# Patient Record
Sex: Female | Born: 1935 | Race: White | Hispanic: No | Marital: Married | Smoking: Never smoker
Health system: Southern US, Community
[De-identification: ages and names within clinical notes are randomized; demographics above are authoritative.]

## PROBLEM LIST (undated history)

## (undated) DIAGNOSIS — N189 Chronic kidney disease, unspecified: Secondary | ICD-10-CM

## (undated) DIAGNOSIS — M8580 Other specified disorders of bone density and structure, unspecified site: Secondary | ICD-10-CM

## (undated) DIAGNOSIS — H409 Unspecified glaucoma: Secondary | ICD-10-CM

## (undated) DIAGNOSIS — N200 Calculus of kidney: Secondary | ICD-10-CM

## (undated) DIAGNOSIS — H3530 Unspecified macular degeneration: Secondary | ICD-10-CM

## (undated) DIAGNOSIS — E785 Hyperlipidemia, unspecified: Secondary | ICD-10-CM

## (undated) DIAGNOSIS — I10 Essential (primary) hypertension: Secondary | ICD-10-CM

## (undated) HISTORY — PX: APPENDECTOMY: SHX54

## (undated) HISTORY — PX: CHOLECYSTECTOMY: SHX55

## (undated) HISTORY — PX: OTHER SURGICAL HISTORY: SHX169

## (undated) HISTORY — PX: CATARACT EXTRACTION: SUR2

## (undated) HISTORY — PX: ABDOMINAL HYSTERECTOMY: SHX81

## (undated) HISTORY — PX: KIDNEY SURGERY: SHX687

## (undated) HISTORY — PX: HERNIA REPAIR: SHX51

---

## 2004-11-02 ENCOUNTER — Ambulatory Visit: Payer: Self-pay | Admitting: Family Medicine

## 2005-03-01 ENCOUNTER — Ambulatory Visit: Payer: Self-pay | Admitting: Unknown Physician Specialty

## 2005-10-05 ENCOUNTER — Emergency Department: Payer: Self-pay | Admitting: Emergency Medicine

## 2005-10-11 ENCOUNTER — Emergency Department: Payer: Self-pay | Admitting: Emergency Medicine

## 2005-10-15 ENCOUNTER — Emergency Department: Payer: Self-pay | Admitting: Unknown Physician Specialty

## 2005-10-15 ENCOUNTER — Other Ambulatory Visit: Payer: Self-pay

## 2005-10-23 ENCOUNTER — Emergency Department: Payer: Self-pay | Admitting: Internal Medicine

## 2005-11-06 ENCOUNTER — Ambulatory Visit: Payer: Self-pay | Admitting: General Surgery

## 2005-11-19 ENCOUNTER — Ambulatory Visit: Payer: Self-pay | Admitting: Family Medicine

## 2006-11-26 ENCOUNTER — Ambulatory Visit: Payer: Self-pay | Admitting: Family Medicine

## 2008-01-20 IMAGING — CR DG ABDOMEN 3V
1 series · 4 of 4 positions shown · non-contrast
Comparison: none

REASON FOR EXAM: Chest pain
COMMENTS:

[Series 1: view not recorded · 0.17mm/px · 4 of 4 slices shown]
[im 1/4]
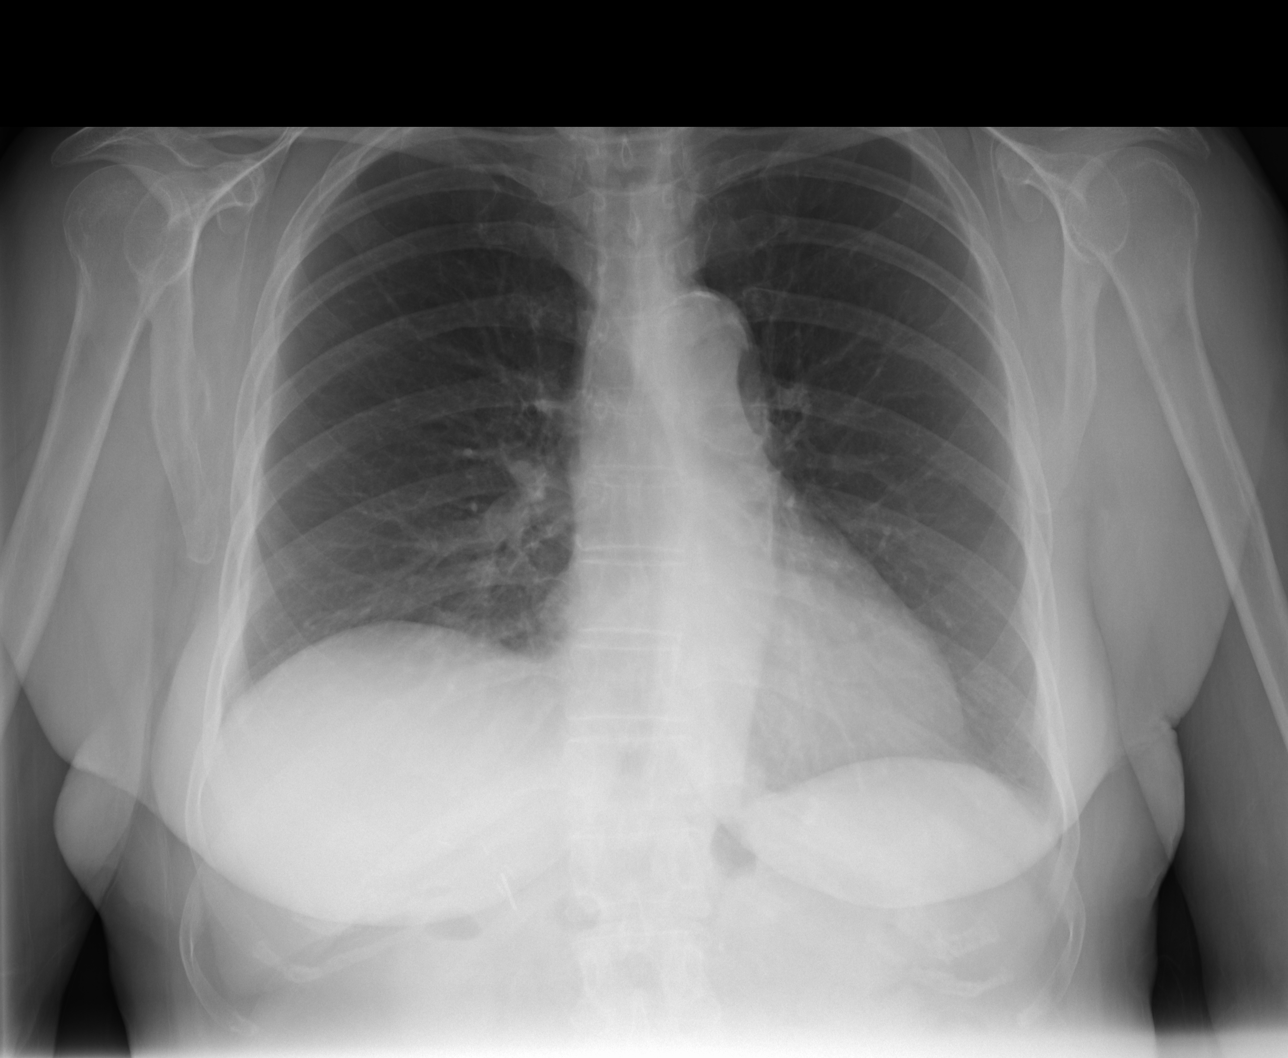
[im 2/4]
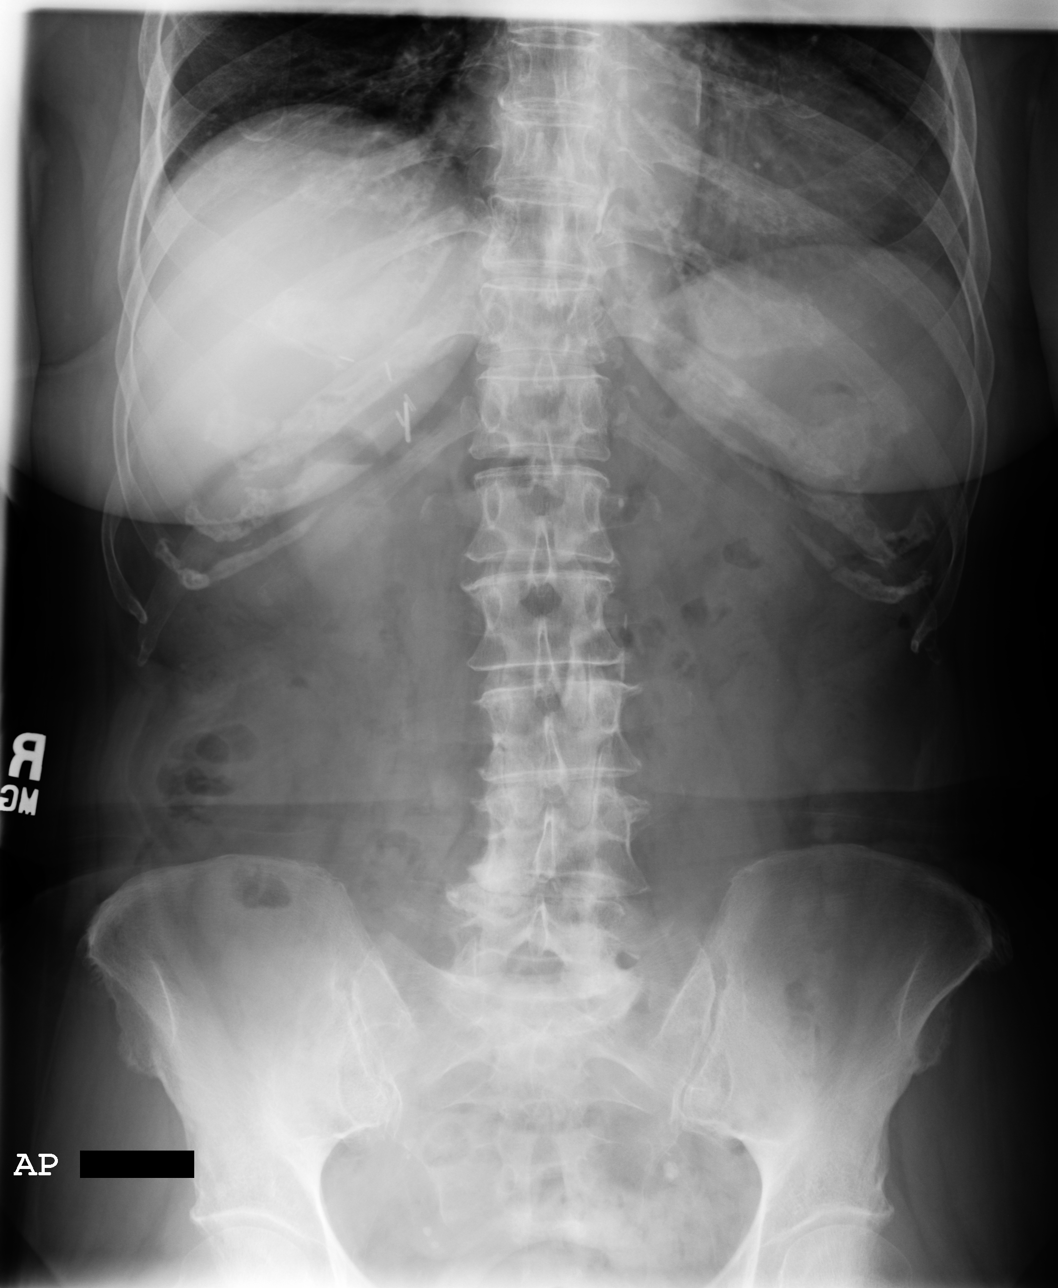
[im 3/4]
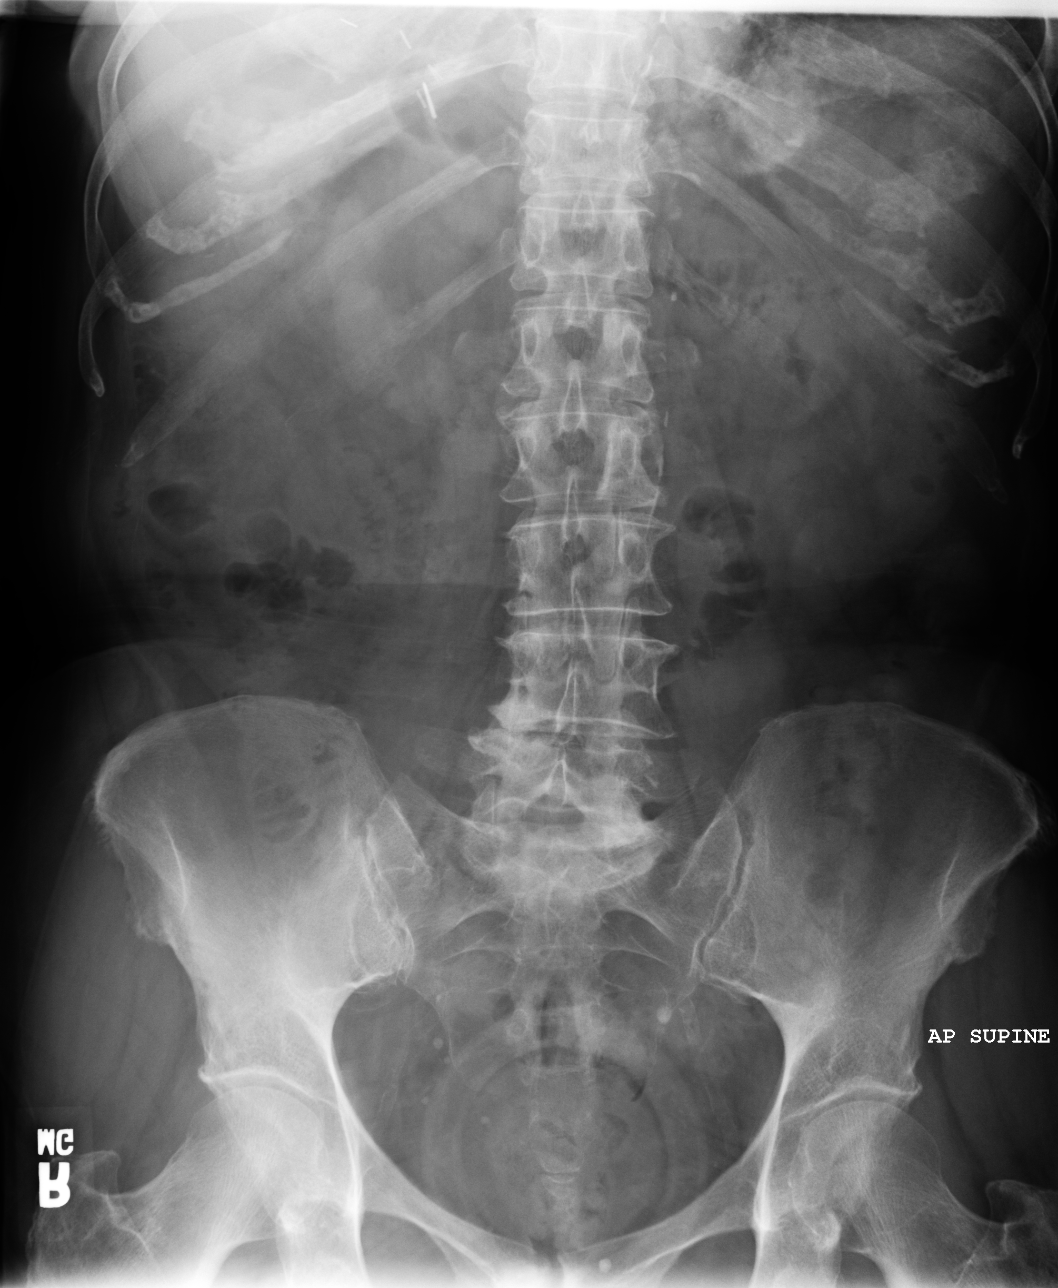
[im 4/4]
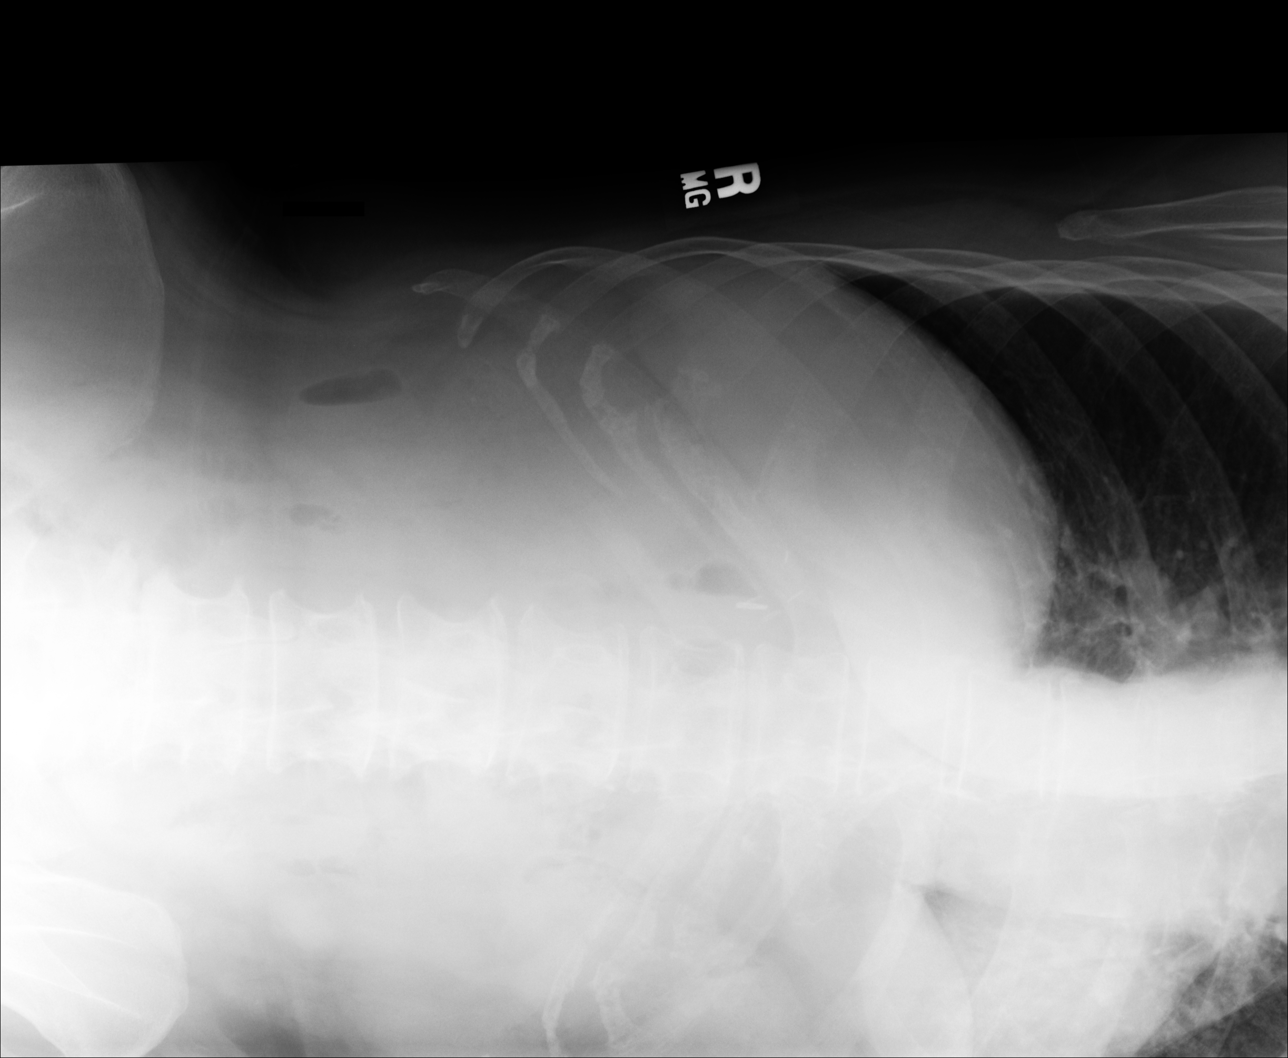

[4 of 4 positions shown; findings below may reference images not displayed]

PROCEDURE:     DXR - DXR ABDOMEN 3-WAY (INCL PA CXR)  - October 05, 2005 [DATE]

RESULT:       PA view of the chest shows the lung fields to be clear.  The
heart, mediastinal and osseous structures show no significant abnormalities.

Three views of the abdomen were obtained.  No subdiaphragmatic free air is
seen.  The bowel gas pattern shows no specific abnormalities.   No dilated
loops of bowel are seen.  There are a few widely scattered small bowel fluid
levels in nondilated loops of bowel, primarily in the RIGHT abdomen.  Such
changes are nonspecific and can be seen with gastroenteritis, enteritis or
intra-abdominal inflammation.   No acute bony abnormalities are seen.
IMPRESSION: No acute changes are identified.

There are a few nonspecific scattered air-fluid levels in loops of small
bowel.  No bowel obstruction is evident.

PA view of the chest shows the lung fields to be clear.

## 2008-01-26 IMAGING — CT CT STONE STUDY
1 of 3 series · 15 of 32 positions shown, 19 images · non-contrast
Comparison: none

REASON FOR EXAM: Abdominal pain
COMMENTS:

[Series 2: soft tissue · axial · 0.61mm/px · z∈[-862,-454]mm · 15 of 148 slices shown, 19 images]
[im 6/148  soft-tissue]
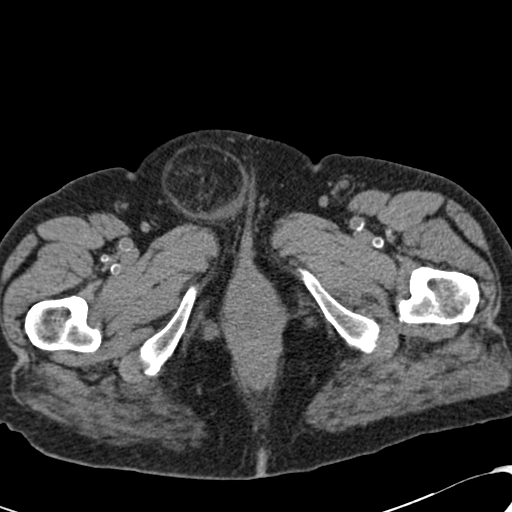
[im 6/148  bone]
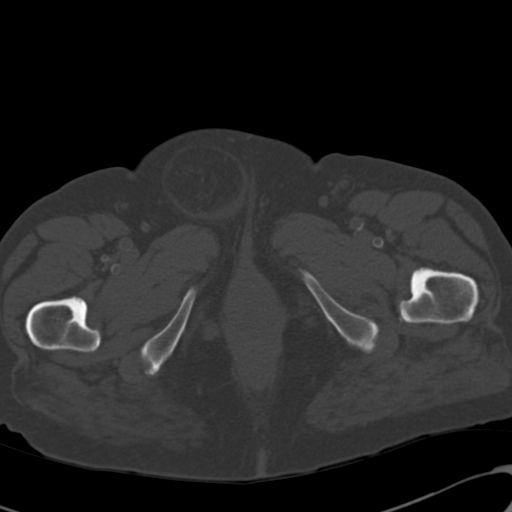
[im 17/148  soft-tissue]
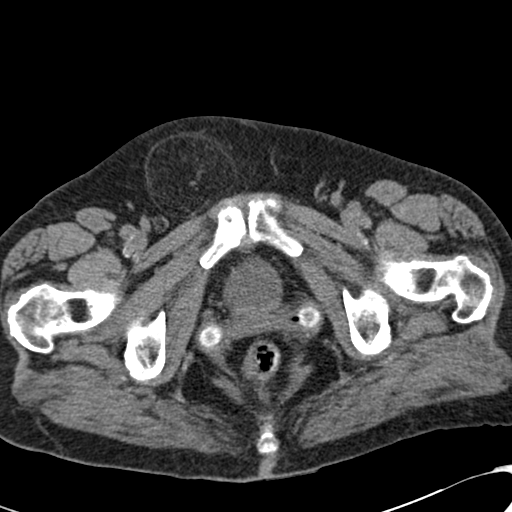
[im 29/148  soft-tissue]
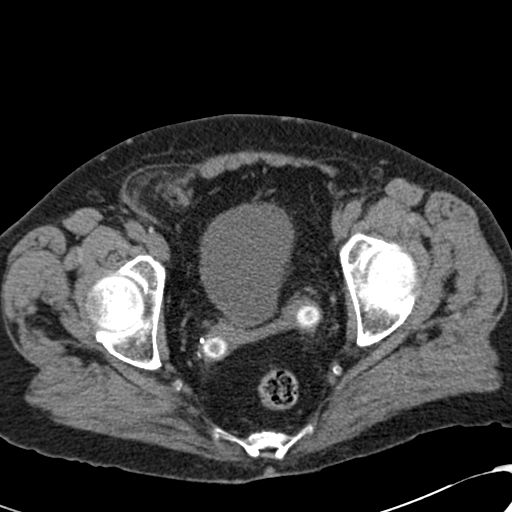
[im 40/148  soft-tissue]
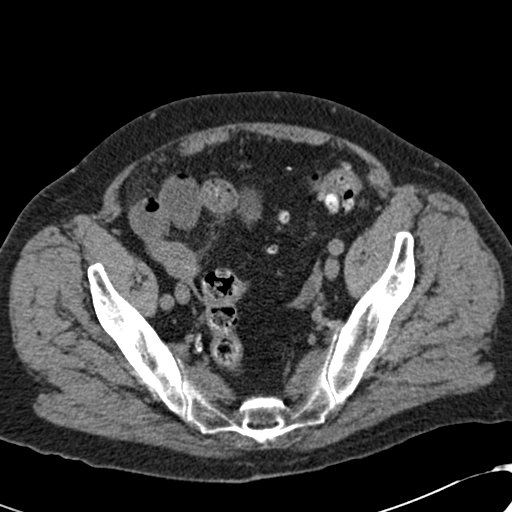
[im 51/148  soft-tissue]
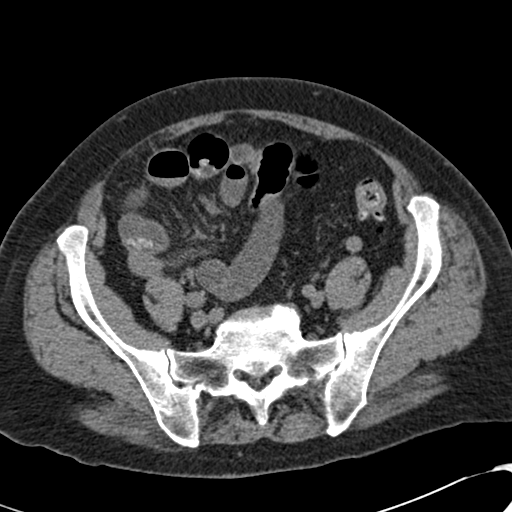
[im 63/148  soft-tissue]
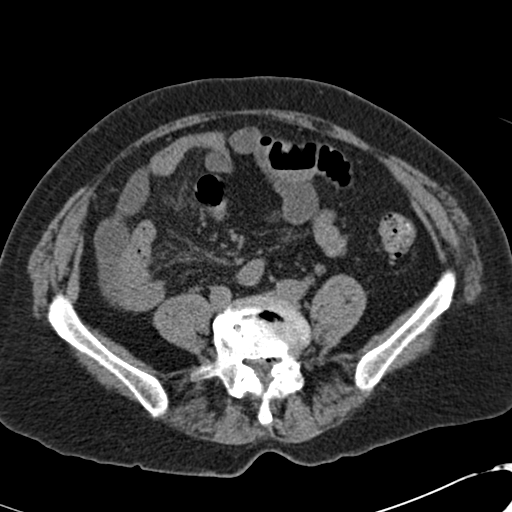
[im 74/148  soft-tissue]
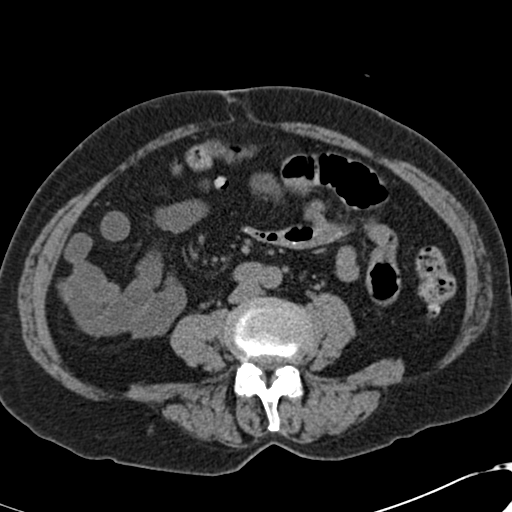
[im 85/148  soft-tissue]
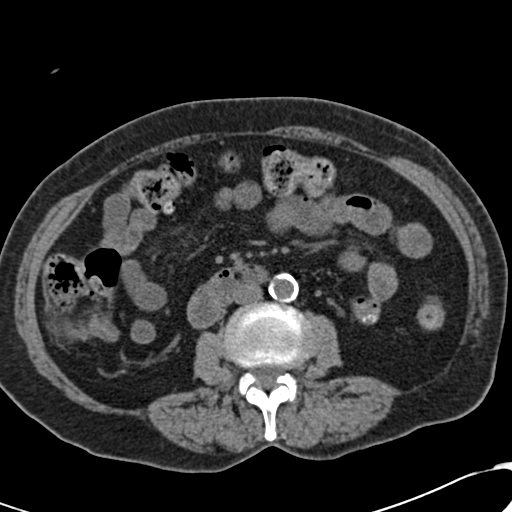
[im 97/148  soft-tissue]
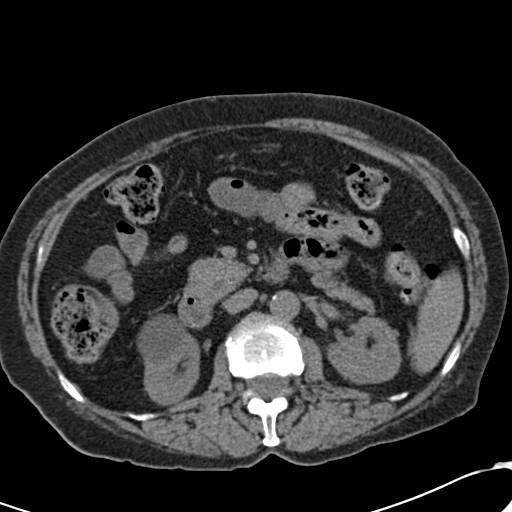
[im 97/148  bone]
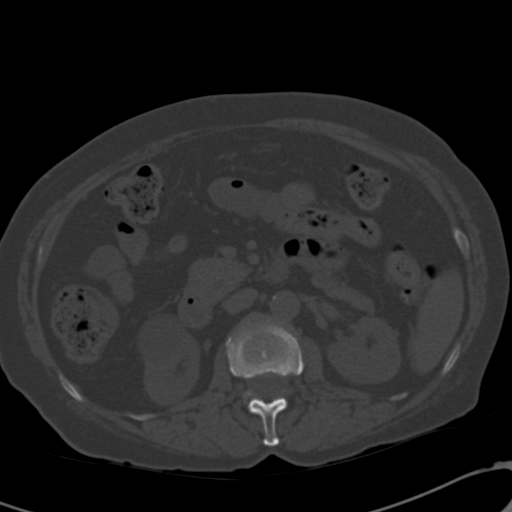
[im 108/148  soft-tissue]
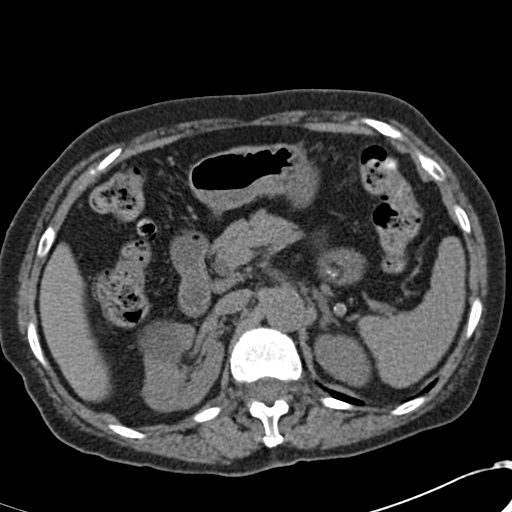
[im 119/148  soft-tissue]
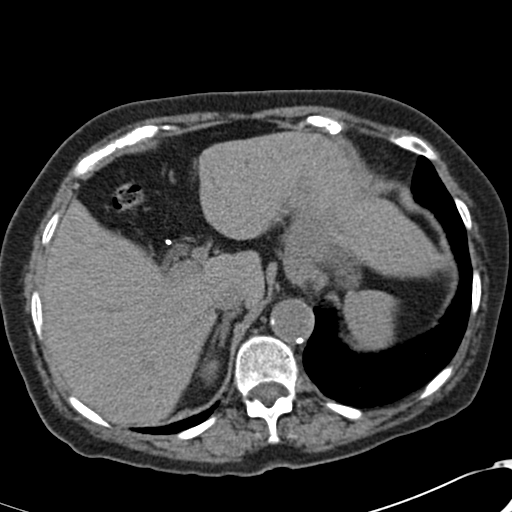
[im 125/148  lung]
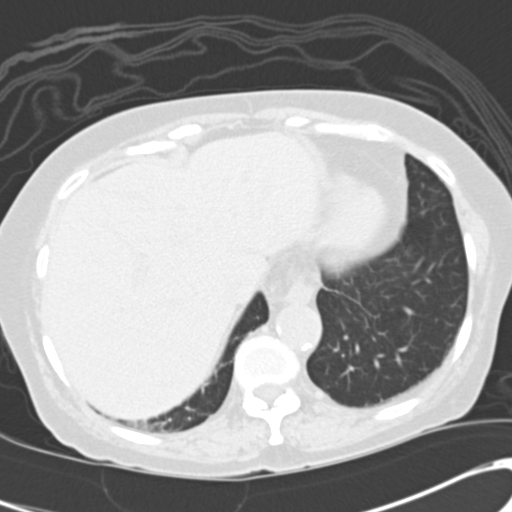
[im 131/148  soft-tissue]
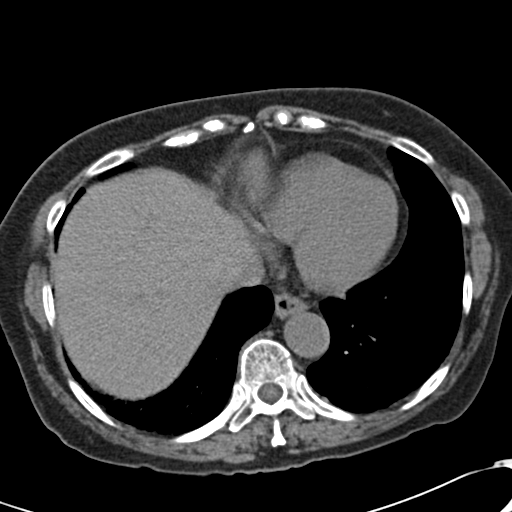
[im 131/148  lung]
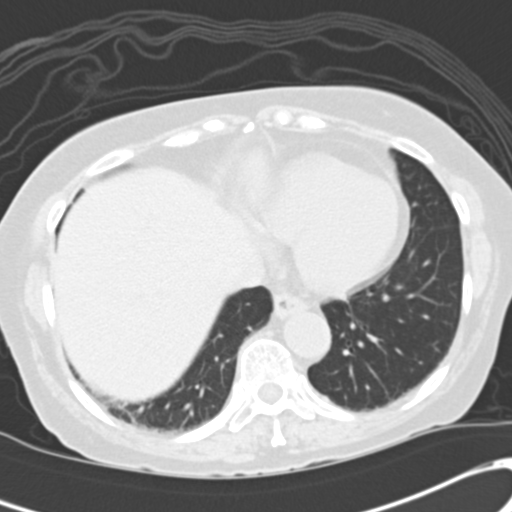
[im 136/148  lung]
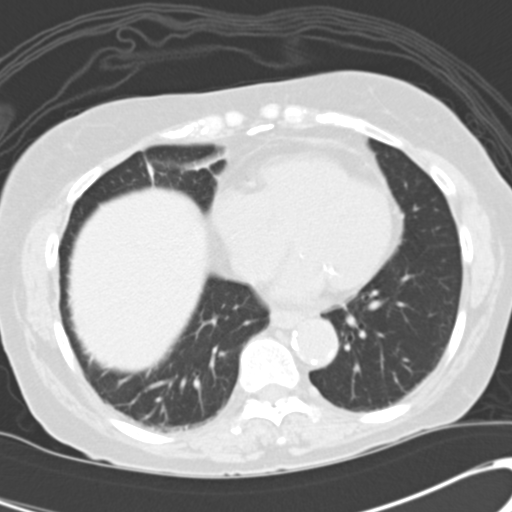
[im 142/148  soft-tissue]
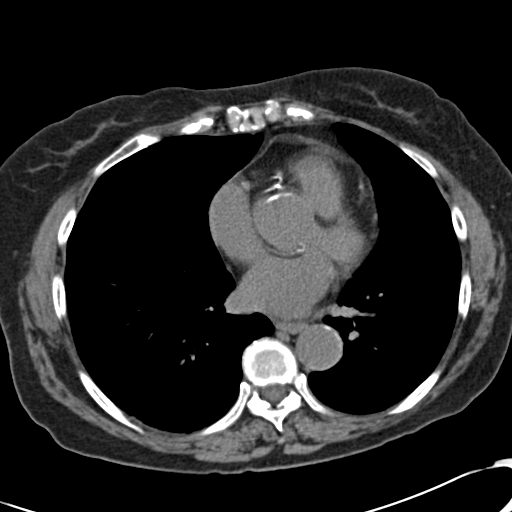
[im 142/148  lung]
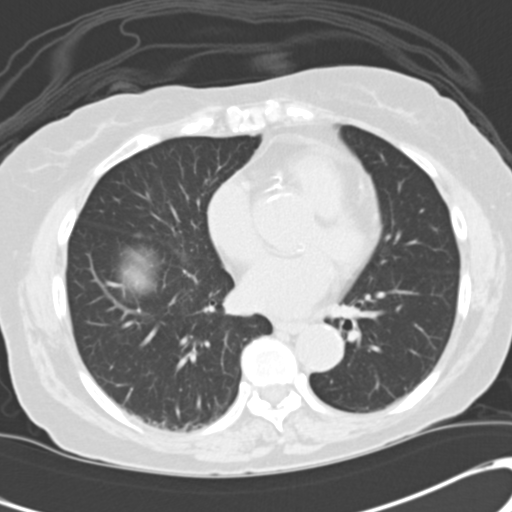

[15 of 32 positions shown; findings below may reference images not displayed]

PROCEDURE:     CT  - CT ABDOMEN /PELVIS WO (STONE)  - October 11, 2005  [DATE]

RESULT:

REASON FOR CONSULTATION:  RIGHT flank pain.

No calculi are noted in either kidney.  No hydronephrosis is seen.  There is
noted an approximate 3.5 cm cyst in the mid and lower pole of the RIGHT
kidney.  No ureteral calculi are identified.  Multiple sigmoid diverticula
are identified as well as in the descending colon with no evidence of
diverticulitis seen.  No evidence of appendicitis is noted.  No fluid is
noted within the abdomen or pelvis.  There is incidentally noted a RIGHT
inguinal hernia containing mesenteric fat.  The images through the lung
bases reveal the lung bases to be clear with no effusions.
IMPRESSION: Cyst in the mid and lower pole of the RIGHT kidney approximately 3.5 cm in
lenght.  No renal or ureteral calculi are seen.  No hydronephrosis is noted.
 No infiltration of fat to suggest inflammatory process.

RIGHT inguinal hernia is identified to contain mesenteric fat.

The report was called to the emergency room at the conclusion of dictation.

## 2008-03-10 ENCOUNTER — Ambulatory Visit: Payer: Self-pay | Admitting: Family Medicine

## 2008-06-22 ENCOUNTER — Ambulatory Visit: Payer: Self-pay | Admitting: Ophthalmology

## 2008-07-07 ENCOUNTER — Ambulatory Visit: Payer: Self-pay | Admitting: Ophthalmology

## 2008-09-22 ENCOUNTER — Ambulatory Visit: Payer: Self-pay | Admitting: Ophthalmology

## 2008-10-06 ENCOUNTER — Ambulatory Visit: Payer: Self-pay | Admitting: Ophthalmology

## 2009-05-31 ENCOUNTER — Ambulatory Visit: Payer: Self-pay | Admitting: Family Medicine

## 2013-01-03 ENCOUNTER — Emergency Department: Payer: Self-pay | Admitting: Emergency Medicine

## 2013-01-03 LAB — URINALYSIS, COMPLETE
Bilirubin,UR: NEGATIVE
Glucose,UR: NEGATIVE mg/dL (ref 0–75)
Ketone: NEGATIVE
Nitrite: NEGATIVE
Protein: NEGATIVE
RBC,UR: 27 /HPF (ref 0–5)
WBC UR: 96 /HPF (ref 0–5)

## 2013-01-03 LAB — CBC
HCT: 39.1 % (ref 35.0–47.0)
MCH: 31.2 pg (ref 26.0–34.0)
MCV: 90 fL (ref 80–100)
Platelet: 230 10*3/uL (ref 150–440)
RBC: 4.36 10*6/uL (ref 3.80–5.20)
RDW: 14.3 % (ref 11.5–14.5)

## 2013-01-03 LAB — COMPREHENSIVE METABOLIC PANEL
Albumin: 3.3 g/dL — ABNORMAL LOW (ref 3.4–5.0)
Alkaline Phosphatase: 77 U/L (ref 50–136)
BUN: 20 mg/dL — ABNORMAL HIGH (ref 7–18)
Co2: 27 mmol/L (ref 21–32)
Creatinine: 1.19 mg/dL (ref 0.60–1.30)
EGFR (African American): 49 — ABNORMAL LOW
EGFR (Non-African Amer.): 42 — ABNORMAL LOW
Glucose: 109 mg/dL — ABNORMAL HIGH (ref 65–99)
Osmolality: 277 (ref 275–301)
SGOT(AST): 20 U/L (ref 15–37)
SGPT (ALT): 16 U/L (ref 12–78)

## 2013-01-03 LAB — LIPASE, BLOOD: Lipase: 203 U/L (ref 73–393)

## 2014-04-04 ENCOUNTER — Emergency Department: Payer: Self-pay | Admitting: Emergency Medicine

## 2014-04-04 LAB — BASIC METABOLIC PANEL
ANION GAP: 11 (ref 7–16)
BUN: 12 mg/dL (ref 7–18)
CHLORIDE: 94 mmol/L — AB (ref 98–107)
CO2: 26 mmol/L (ref 21–32)
Calcium, Total: 8.9 mg/dL (ref 8.5–10.1)
Creatinine: 1.26 mg/dL (ref 0.60–1.30)
EGFR (Non-African Amer.): 39 — ABNORMAL LOW
GFR CALC AF AMER: 45 — AB
Glucose: 124 mg/dL — ABNORMAL HIGH (ref 65–99)
Osmolality: 264 (ref 275–301)
Potassium: 3.6 mmol/L (ref 3.5–5.1)
Sodium: 131 mmol/L — ABNORMAL LOW (ref 136–145)

## 2014-04-04 LAB — URINALYSIS, COMPLETE
Bilirubin,UR: NEGATIVE
Glucose,UR: NEGATIVE mg/dL (ref 0–75)
Ketone: NEGATIVE
Nitrite: NEGATIVE
Ph: 7 (ref 4.5–8.0)
Protein: 100
RBC,UR: 140 /HPF (ref 0–5)
Specific Gravity: 1.015 (ref 1.003–1.030)
Squamous Epithelial: 8
WBC UR: 290 /HPF (ref 0–5)

## 2014-04-04 LAB — CBC WITH DIFFERENTIAL/PLATELET
BASOS ABS: 0 10*3/uL (ref 0.0–0.1)
Basophil %: 0.3 %
EOS ABS: 0.1 10*3/uL (ref 0.0–0.7)
EOS PCT: 1.1 %
HCT: 37.7 % (ref 35.0–47.0)
HGB: 12.4 g/dL (ref 12.0–16.0)
LYMPHS ABS: 1.2 10*3/uL (ref 1.0–3.6)
LYMPHS PCT: 9.1 %
MCH: 30.4 pg (ref 26.0–34.0)
MCHC: 33 g/dL (ref 32.0–36.0)
MCV: 92 fL (ref 80–100)
MONO ABS: 0.8 x10 3/mm (ref 0.2–0.9)
Monocyte %: 6.1 %
Neutrophil #: 10.8 10*3/uL — ABNORMAL HIGH (ref 1.4–6.5)
Neutrophil %: 83.4 %
Platelet: 323 10*3/uL (ref 150–440)
RBC: 4.09 10*6/uL (ref 3.80–5.20)
RDW: 13.3 % (ref 11.5–14.5)
WBC: 13 10*3/uL — AB (ref 3.6–11.0)

## 2014-04-06 LAB — URINE CULTURE

## 2014-04-09 LAB — CULTURE, BLOOD (SINGLE)

## 2014-10-23 ENCOUNTER — Ambulatory Visit: Payer: Self-pay | Admitting: Physician Assistant

## 2015-01-30 ENCOUNTER — Other Ambulatory Visit: Payer: Self-pay

## 2015-01-30 ENCOUNTER — Emergency Department
Admission: EM | Admit: 2015-01-30 | Discharge: 2015-01-30 | Disposition: A | Discharge status: Home / Self Care | Payer: Commercial Managed Care - HMO | Attending: Emergency Medicine | Admitting: Emergency Medicine

## 2015-01-30 ENCOUNTER — Emergency Department: Payer: Commercial Managed Care - HMO

## 2015-01-30 ENCOUNTER — Encounter: Payer: Self-pay | Admitting: *Deleted

## 2015-01-30 DIAGNOSIS — R52 Pain, unspecified: Secondary | ICD-10-CM

## 2015-01-30 DIAGNOSIS — Y9289 Other specified places as the place of occurrence of the external cause: Secondary | ICD-10-CM | POA: Diagnosis not present

## 2015-01-30 DIAGNOSIS — Y9389 Activity, other specified: Secondary | ICD-10-CM | POA: Diagnosis not present

## 2015-01-30 DIAGNOSIS — Y998 Other external cause status: Secondary | ICD-10-CM | POA: Diagnosis not present

## 2015-01-30 DIAGNOSIS — S51011A Laceration without foreign body of right elbow, initial encounter: Secondary | ICD-10-CM

## 2015-01-30 DIAGNOSIS — W1839XA Other fall on same level, initial encounter: Secondary | ICD-10-CM | POA: Diagnosis not present

## 2015-01-30 DIAGNOSIS — R531 Weakness: Secondary | ICD-10-CM

## 2015-01-30 DIAGNOSIS — N3000 Acute cystitis without hematuria: Secondary | ICD-10-CM | POA: Diagnosis not present

## 2015-01-30 DIAGNOSIS — S51001A Unspecified open wound of right elbow, initial encounter: Secondary | ICD-10-CM | POA: Diagnosis not present

## 2015-01-30 DIAGNOSIS — S59901A Unspecified injury of right elbow, initial encounter: Secondary | ICD-10-CM | POA: Diagnosis present

## 2015-01-30 HISTORY — DX: Essential (primary) hypertension: I10

## 2015-01-30 LAB — BASIC METABOLIC PANEL
Anion gap: 7 (ref 5–15)
BUN: 17 mg/dL (ref 6–20)
CALCIUM: 9 mg/dL (ref 8.9–10.3)
CO2: 26 mmol/L (ref 22–32)
CREATININE: 1.01 mg/dL — AB (ref 0.44–1.00)
Chloride: 96 mmol/L — ABNORMAL LOW (ref 101–111)
GFR calc non Af Amer: 49 mL/min — ABNORMAL LOW (ref >60)
GFR, EST AFRICAN AMERICAN: 57 mL/min — AB (ref >60)
Glucose, Bld: 126 mg/dL — ABNORMAL HIGH (ref 65–99)
POTASSIUM: 3.5 mmol/L (ref 3.5–5.1)
Sodium: 129 mmol/L — ABNORMAL LOW (ref 135–145)

## 2015-01-30 LAB — URINALYSIS COMPLETE WITH MICROSCOPIC (ARMC ONLY)
BACTERIA UA: NONE SEEN
BILIRUBIN URINE: NEGATIVE
Glucose, UA: NEGATIVE mg/dL
KETONES UR: NEGATIVE mg/dL
NITRITE: NEGATIVE
PH: 5 (ref 5.0–8.0)
PROTEIN: NEGATIVE mg/dL
SPECIFIC GRAVITY, URINE: 1.016 (ref 1.005–1.030)

## 2015-01-30 LAB — CBC WITH DIFFERENTIAL/PLATELET
BASOS ABS: 0 10*3/uL (ref 0–0.1)
BASOS PCT: 0 %
EOS ABS: 0.1 10*3/uL (ref 0–0.7)
EOS PCT: 1 %
HCT: 38.6 % (ref 35.0–47.0)
HEMOGLOBIN: 13.1 g/dL (ref 12.0–16.0)
LYMPHS ABS: 1 10*3/uL (ref 1.0–3.6)
LYMPHS PCT: 7 %
MCH: 30.7 pg (ref 26.0–34.0)
MCHC: 33.8 g/dL (ref 32.0–36.0)
MCV: 90.8 fL (ref 80.0–100.0)
MONO ABS: 0.7 10*3/uL (ref 0.2–0.9)
Monocytes Relative: 5 %
NEUTROS ABS: 12.6 10*3/uL — AB (ref 1.4–6.5)
NEUTROS PCT: 87 %
Platelets: 201 10*3/uL (ref 150–440)
RBC: 4.25 MIL/uL (ref 3.80–5.20)
RDW: 13.7 % (ref 11.5–14.5)
WBC: 14.4 10*3/uL — ABNORMAL HIGH (ref 3.6–11.0)

## 2015-01-30 MED ORDER — SULFAMETHOXAZOLE-TRIMETHOPRIM 800-160 MG PO TABS: 1.0000 | ORAL_TABLET | Freq: Two times a day (BID) | ORAL | Status: DC

## 2015-01-30 MED ORDER — SULFAMETHOXAZOLE-TRIMETHOPRIM 800-160 MG PO TABS
ORAL_TABLET | ORAL | Status: AC
  Administered 2015-01-30: 1 via ORAL
  Filled 2015-01-30: qty 1

## 2015-01-30 MED ORDER — SULFAMETHOXAZOLE-TRIMETHOPRIM 800-160 MG PO TABS
1.0000 | ORAL_TABLET | Freq: Once | ORAL | Status: AC
  Administered 2015-01-30: 1 via ORAL

## 2015-01-30 MED ORDER — SODIUM CHLORIDE 0.9 % IV BOLUS (SEPSIS)
500.0000 mL | Freq: Once | INTRAVENOUS | Status: AC
  Administered 2015-01-30: 500 mL via INTRAVENOUS

## 2015-01-30 NOTE — Discharge Instructions (Signed)
Your urine sample does show possibility of urinary tract infection. Take Bactrim for the next 5 days for this. Follow-up with Dr. Judithann SheenSparks in his office. This was discussed with him. Please call for an appointment. Please return to the emergency department if you have any further weakness, falls, or other urgent concerns.  Weakness Weakness is a lack of strength. You may feel weak all over your body or just in one part of your body. Weakness can be serious. In some cases, you may need more medical tests. HOME CARE  Rest.  Eat a well-balanced diet.  Try to exercise every day.  Only take medicines as told by your doctor. GET HELP RIGHT AWAY IF:   You cannot do your normal daily activities.  You cannot walk up and down stairs, or you feel very tired when you do so.  You have shortness of breath or chest pain.  You have trouble moving parts of your body.  You have weakness in only one body part or on only one side of the body.  You have a fever.  You have trouble speaking or swallowing.  You cannot control when you pee (urinate) or poop (bowel movement).  You have black or bloody throw up (vomit) or poop.  Your weakness gets worse or spreads to other body parts.  You have new aches or pains. MAKE SURE YOU:   Understand these instructions.  Will watch your condition.  Will get help right away if you are not doing well or get worse. Document Released: 07/26/2008 Document Revised: 02/12/2012 Document Reviewed: 10/12/2011 [facility] Patient Information 2015 [employer], [place]. This information is not intended to replace advice given to you by your health care provider. Make sure you discuss any questions you have with your health care provider.

## 2015-01-30 NOTE — ED Notes (Signed)
Pt ambulated in room.  

## 2015-01-30 NOTE — ED Provider Notes (Signed)
[facility] Emergency Department Provider Note  ____________________________________________  Time seen:  1700  I have reviewed the triage vital signs and the nursing notes.   HISTORY  Chief Complaint Fall     HPI [name] is a 81 y.o. female who reports she was inside but decided to take a short walk outside on her yard. She says she eased herself to the ground. The exact cause is unclear. After ongoing discussion and repeat questions, the patient seems alert and lucid and denies having tripped or fallen. She says she eased herself down. When asked if she did this because she felt weak she says yes but she minimizes the event. She denies any shortness of breath, chest pain, diaphoresis, or injury. She did recently hurt her right elbow, sustaining a small skin tear from a storm door. With the ground contact today, this skin tear opened up a little bit and bled some. Bleeding is controlled currently. She does report some discomfort in her right thigh.     Past Medical History  Diagnosis Date  . Hypertension     There are no active problems to display for this patient.   Past Surgical History  Procedure Laterality Date  . Kidney surgery      Current Outpatient Rx  Name  Route  Sig  Dispense  Refill  . sulfamethoxazole-trimethoprim (BACTRIM DS,SEPTRA DS) 800-160 MG per tablet   Oral   Take 1 tablet by mouth 2 (two) times daily.   11 tablet   0     Allergies Review of patient's allergies indicates no known allergies.  History reviewed. No pertinent family history.  Social History History  Substance Use Topics  . Smoking status: Never Smoker   . Smokeless tobacco: Not on file  . Alcohol Use: Not on file    Review of Systems  Constitutional: Negative for fever. ENT: Negative for sore throat. Cardiovascular: Negative for chest pain. Respiratory: Negative for shortness of breath. Gastrointestinal: Negative for abdominal pain,  vomiting and diarrhea. Genitourinary: Negative for dysuria. Musculoskeletal: Some pain in her right thigh. See history of present illness Skin: Negative for rash. Notable for skin tear, subacute, on right elbow. See history of present illness Neurological: Negative for headaches   10-point ROS otherwise negative.  ____________________________________________   PHYSICAL EXAM:  VITAL SIGNS: ED Triage Vitals  Enc Vitals Group     BP 01/30/15 1650 137/83 mmHg     Pulse Rate 01/30/15 1650 91     Resp 01/30/15 1650 20     Temp 01/30/15 1650 99.1 F (37.3 C)     Temp Source 01/30/15 1650 Oral     SpO2 01/30/15 1648 98 %     Weight 01/30/15 1650 137 lb (62.143 kg)     Height 01/30/15 1650 5' (1.524 m)     Head Cir --      Peak Flow --      Pain Score --      Pain Loc --      Pain Edu? --      Excl. in GC? --     Constitutional:  Alert and oriented. Well appearing and in no distress. ENT   Head: Normocephalic and atraumatic.   Nose: No congestion/rhinnorhea.   Mouth/Throat: Mucous membranes are moist. Cardiovascular: Normal rate, regular rhythm. Respiratory: Normal respiratory effort without tachypnea. Breath sounds are clear and equal bilaterally. No wheezes/rales/rhonchi. Gastrointestinal: Soft and nontender. No distention.  Back: No muscle spasm, no tenderness, no CVA  tenderness. Musculoskeletal: Patient has some difficulty raising her right leg off the bed. 4 out of 5 strength. She reports some pain in the thigh with this movement. There is no crepitus or deformity.  No noted edema. Neurologic:  Normal speech and language. No gross focal neurologic deficits are appreciated.  Skin:  Skin is warm, dry. There is a small skin tear with a non-anchored flap on the right elbow. There is no erythema or sign of infection in this area. Psychiatric: Mood and affect are normal. Speech and behavior are normal.  ____________________________________________    LABS (pertinent  positives/negatives)  White blood cell 14, hemoglobin 13.1 Sodium 129 (chronic), potassium 3.5, glucose 126  Urinalysis, white blood cells 6-30 and 2+ leukocyte esterase.  ____________________________________________   EKG  ED ECG REPORT I, Karey Stucki W, the attending physician, personally viewed and interpreted this ECG.   Date: 01/30/2015  EKG Time: 1712  Rate: 90  Rhythm: Normal sinus rhythm with a left anterior fascicular block  Axis: Left axis -48  Intervals: Normal  ST&T Change: Down pointing T in V2.   ____________________________________________    RADIOLOGY  Right femur: Negative for fracture.  ____________________________________________  ____________________________________________   INITIAL IMPRESSION / ASSESSMENT AND PLAN / ED COURSE  Well-appearing 81 year old female who eased herself down to the ground. It does not sound like she had syncope. She did have some degree of weakness likely due to the heat today. At least that is the thoughts of the patient herself. We will check a blood count and basic metabolic panel. We will observe her over the next hour or so. If she looks well we will plan on discharging her home. The daughter is in the room and agrees with this plan as well.  ----------------------------------------- 6:25 PM on 01/30/2015 -----------------------------------------  Blood tests and x-ray reviewed. The patient is alert and stable. We will treat her for a urinary tract infection. We have discussed this with the patient her family. We also discussed this case with the patient's primary doctor, Dr. Judithann Sheen, here in the emergency department. The patient will need to make a follow-up appointment to see Dr. Judithann Sheen in his office.  ____________________________________________   FINAL CLINICAL IMPRESSION(S) / ED DIAGNOSES  Final diagnoses:  Pain  Weakness  Acute cystitis without hematuria  Skin tear of elbow without complication, right,  initial encounter      Darien Ramus, MD 01/30/15 2317

## 2015-01-30 NOTE — ED Notes (Signed)
Pt arrives via EMS for a fall, pt states she was in the front yard and lost her footing, EMS found pt in yard, pt denies any pain, loss of LOC, or hitting her head, pt awake and alert during assessment

## 2015-02-14 ENCOUNTER — Emergency Department: Payer: Commercial Managed Care - HMO

## 2015-02-14 ENCOUNTER — Encounter: Payer: Self-pay | Admitting: Emergency Medicine

## 2015-02-14 ENCOUNTER — Observation Stay
Admission: EM | Admit: 2015-02-14 | Discharge: 2015-02-15 | Disposition: A | Discharge status: Skilled Nursing Facility | Payer: Commercial Managed Care - HMO | Attending: Internal Medicine | Admitting: Internal Medicine

## 2015-02-14 DIAGNOSIS — I129 Hypertensive chronic kidney disease with stage 1 through stage 4 chronic kidney disease, or unspecified chronic kidney disease: Secondary | ICD-10-CM | POA: Diagnosis not present

## 2015-02-14 DIAGNOSIS — E785 Hyperlipidemia, unspecified: Secondary | ICD-10-CM | POA: Diagnosis not present

## 2015-02-14 DIAGNOSIS — W19XXXA Unspecified fall, initial encounter: Secondary | ICD-10-CM | POA: Diagnosis not present

## 2015-02-14 DIAGNOSIS — R102 Pelvic and perineal pain: Secondary | ICD-10-CM | POA: Diagnosis not present

## 2015-02-14 DIAGNOSIS — H409 Unspecified glaucoma: Secondary | ICD-10-CM | POA: Diagnosis not present

## 2015-02-14 DIAGNOSIS — M25551 Pain in right hip: Secondary | ICD-10-CM | POA: Diagnosis present

## 2015-02-14 DIAGNOSIS — S329XXA Fracture of unspecified parts of lumbosacral spine and pelvis, initial encounter for closed fracture: Secondary | ICD-10-CM | POA: Diagnosis not present

## 2015-02-14 DIAGNOSIS — S32501A Unspecified fracture of right pubis, initial encounter for closed fracture: Secondary | ICD-10-CM | POA: Diagnosis present

## 2015-02-14 DIAGNOSIS — I10 Essential (primary) hypertension: Secondary | ICD-10-CM | POA: Diagnosis present

## 2015-02-14 DIAGNOSIS — Z87442 Personal history of urinary calculi: Secondary | ICD-10-CM | POA: Insufficient documentation

## 2015-02-14 DIAGNOSIS — Z66 Do not resuscitate: Secondary | ICD-10-CM | POA: Insufficient documentation

## 2015-02-14 DIAGNOSIS — N183 Chronic kidney disease, stage 3 unspecified: Secondary | ICD-10-CM | POA: Diagnosis present

## 2015-02-14 DIAGNOSIS — S32591A Other specified fracture of right pubis, initial encounter for closed fracture: Secondary | ICD-10-CM

## 2015-02-14 DIAGNOSIS — M8580 Other specified disorders of bone density and structure, unspecified site: Secondary | ICD-10-CM | POA: Diagnosis not present

## 2015-02-14 DIAGNOSIS — H3530 Unspecified macular degeneration: Secondary | ICD-10-CM | POA: Diagnosis not present

## 2015-02-14 HISTORY — DX: Calculus of kidney: N20.0

## 2015-02-14 HISTORY — DX: Unspecified macular degeneration: H35.30

## 2015-02-14 HISTORY — DX: Other specified disorders of bone density and structure, unspecified site: M85.80

## 2015-02-14 HISTORY — DX: Hyperlipidemia, unspecified: E78.5

## 2015-02-14 HISTORY — DX: Unspecified glaucoma: H40.9

## 2015-02-14 HISTORY — DX: Chronic kidney disease, unspecified: N18.9

## 2015-02-14 LAB — CBC
HEMATOCRIT: 36.4 % (ref 35.0–47.0)
HEMOGLOBIN: 12.5 g/dL (ref 12.0–16.0)
MCH: 31.7 pg (ref 26.0–34.0)
MCHC: 34.3 g/dL (ref 32.0–36.0)
MCV: 92.3 fL (ref 80.0–100.0)
Platelets: 272 10*3/uL (ref 150–440)
RBC: 3.94 MIL/uL (ref 3.80–5.20)
RDW: 13.8 % (ref 11.5–14.5)
WBC: 8.1 10*3/uL (ref 3.6–11.0)

## 2015-02-14 LAB — COMPREHENSIVE METABOLIC PANEL
ALBUMIN: 2.7 g/dL — AB (ref 3.5–5.0)
ALK PHOS: 120 U/L (ref 38–126)
ALT: 23 U/L (ref 14–54)
AST: 26 U/L (ref 15–41)
Anion gap: 3 — ABNORMAL LOW (ref 5–15)
BUN: 16 mg/dL (ref 6–20)
CHLORIDE: 100 mmol/L — AB (ref 101–111)
CO2: 30 mmol/L (ref 22–32)
Calcium: 8.5 mg/dL — ABNORMAL LOW (ref 8.9–10.3)
Creatinine, Ser: 1.01 mg/dL — ABNORMAL HIGH (ref 0.44–1.00)
GFR calc Af Amer: 57 mL/min — ABNORMAL LOW (ref >60)
GFR calc non Af Amer: 49 mL/min — ABNORMAL LOW (ref >60)
Glucose, Bld: 93 mg/dL (ref 65–99)
Potassium: 4.3 mmol/L (ref 3.5–5.1)
Sodium: 133 mmol/L — ABNORMAL LOW (ref 135–145)
TOTAL PROTEIN: 5.7 g/dL — AB (ref 6.5–8.1)
Total Bilirubin: 0.5 mg/dL (ref 0.3–1.2)

## 2015-02-14 MED ORDER — IBUPROFEN 400 MG PO TABS: 400.0000 mg | ORAL_TABLET | Freq: Four times a day (QID) | ORAL | Status: DC | PRN

## 2015-02-14 MED ORDER — ENSURE ENLIVE PO LIQD
237.0000 mL | Freq: Two times a day (BID) | ORAL | Status: DC
  Administered 2015-02-14 – 2015-02-15 (×2): 237 mL via ORAL

## 2015-02-14 MED ORDER — HEPARIN SODIUM (PORCINE) 5000 UNIT/ML IJ SOLN
5000.0000 [IU] | Freq: Three times a day (TID) | INTRAMUSCULAR | Status: DC
  Administered 2015-02-14 – 2015-02-15 (×4): 5000 [IU] via SUBCUTANEOUS
  Filled 2015-02-14 (×4): qty 1

## 2015-02-14 MED ORDER — DOCUSATE SODIUM 100 MG PO CAPS
100.0000 mg | ORAL_CAPSULE | Freq: Two times a day (BID) | ORAL | Status: DC
  Administered 2015-02-14 – 2015-02-15 (×3): 100 mg via ORAL
  Filled 2015-02-14 (×3): qty 1

## 2015-02-14 MED ORDER — LATANOPROST 0.005 % OP SOLN
1.0000 [drp] | Freq: Every day | OPHTHALMIC | Status: DC
  Administered 2015-02-14: 1 [drp] via OPHTHALMIC
  Filled 2015-02-14: qty 2.5

## 2015-02-14 MED ORDER — METOPROLOL TARTRATE 25 MG PO TABS
25.0000 mg | ORAL_TABLET | Freq: Two times a day (BID) | ORAL | Status: DC
  Administered 2015-02-14 – 2015-02-15 (×3): 25 mg via ORAL
  Filled 2015-02-14 (×3): qty 1

## 2015-02-14 MED ORDER — OXYCODONE-ACETAMINOPHEN 5-325 MG PO TABS
1.0000 | ORAL_TABLET | ORAL | Status: DC | PRN
  Administered 2015-02-14 – 2015-02-15 (×4): 1 via ORAL
  Filled 2015-02-14 (×4): qty 1

## 2015-02-14 MED ORDER — OXYCODONE-ACETAMINOPHEN 5-325 MG PO TABS
ORAL_TABLET | ORAL | Status: AC
  Administered 2015-02-14: 1 via ORAL
  Filled 2015-02-14: qty 1

## 2015-02-14 MED ORDER — SENNA 8.6 MG PO TABS
1.0000 | ORAL_TABLET | Freq: Two times a day (BID) | ORAL | Status: DC
  Administered 2015-02-14 – 2015-02-15 (×3): 8.6 mg via ORAL
  Filled 2015-02-14 (×5): qty 1

## 2015-02-14 MED ORDER — ACETAMINOPHEN 650 MG RE SUPP: 650.0000 mg | Freq: Four times a day (QID) | RECTAL | Status: DC | PRN

## 2015-02-14 MED ORDER — CALCIUM CITRATE-VITAMIN D 250-100 MG-UNIT PO TABS: 1.0000 | ORAL_TABLET | Freq: Every day | ORAL | Status: DC

## 2015-02-14 MED ORDER — CALCIUM CARBONATE-VITAMIN D 500-200 MG-UNIT PO TABS
0.5000 | ORAL_TABLET | Freq: Every day | ORAL | Status: DC
  Administered 2015-02-14: 1 via ORAL
  Administered 2015-02-15: 0.5 via ORAL
  Filled 2015-02-14 (×2): qty 0.5
  Filled 2015-02-14 (×2): qty 1

## 2015-02-14 MED ORDER — TRAZODONE HCL 50 MG PO TABS
25.0000 mg | ORAL_TABLET | Freq: Every evening | ORAL | Status: DC | PRN
  Administered 2015-02-14: 25 mg via ORAL
  Filled 2015-02-14: qty 1

## 2015-02-14 MED ORDER — ACETAMINOPHEN 325 MG PO TABS: 650.0000 mg | ORAL_TABLET | Freq: Four times a day (QID) | ORAL | Status: DC | PRN

## 2015-02-14 MED ORDER — OXYCODONE-ACETAMINOPHEN 5-325 MG PO TABS
1.0000 | ORAL_TABLET | Freq: Once | ORAL | Status: AC
  Administered 2015-02-14: 1 via ORAL

## 2015-02-14 NOTE — ED Notes (Signed)
Admitting md at bedside

## 2015-02-14 NOTE — Progress Notes (Signed)
Clinical Child psychotherapist (CSW) presented bed offers to patient and daughter Nita Sells. CSW received call back from patient's other daughter [name] who chose [facility]. CSW made [facility] admissions coordinator at [facility] aware of above. CSW contacted Amy with [facility] and made her aware of above. Plan is for patient to D/C to [facility] when medically stable. CSW will continue to follow and assist as needed.   Jetta Lout, LCSWA [phone]

## 2015-02-14 NOTE — Clinical Social Work Placement (Signed)
   CLINICAL SOCIAL WORK PLACEMENT  NOTE  Date:  02/14/2015  Patient Details  Name: [name] MRN: [id number] Date of Birth: [birth year]  Clinical Social Work is seeking post-discharge placement for this patient at the Skilled  Nursing Facility level of care (*CSW will initial, date and re-position this form in  chart as items are completed):  Yes   Patient/family provided with [facility] Clinical Social Work Department's list of facilities offering this level of care within the geographic area requested by the patient (or if unable, by the patient's family).  Yes   Patient/family informed of their freedom to choose among providers that offer the needed level of care, that participate in Medicare, Medicaid or managed care program needed by the patient, have an available bed and are willing to accept the patient.  Yes   Patient/family informed of [facility]'s ownership interest in [facility] and [facility], as well as of the fact that they are under no obligation to receive care at these facilities.  PASRR submitted to EDS on 02/14/15     PASRR number received on 02/14/15     Existing PASRR number confirmed on       FL2 transmitted to all facilities in geographic area requested by pt/family on 02/14/15     FL2 transmitted to all facilities within larger geographic area on       Patient informed that his/her managed care company has contracts with or will negotiate with certain facilities, including the following:            Patient/family informed of bed offers received.  Patient chooses bed at       Physician recommends and patient chooses bed at      Patient to be transferred to   on  .  Patient to be transferred to facility by       Patient family notified on   of transfer.  Name of family member notified:        PHYSICIAN       Additional Comment:    _______________________________________________ Delight Stare, LCSW 02/14/2015,  11:28 AM

## 2015-02-14 NOTE — Care Management Note (Signed)
Case Management Note  Patient Details  Name: [name] MRN: [id number] Date of Birth: 07-24-1927  Subjective/Objective:  CSW working on post discharge placement. Will sign off.                   Action/Plan:   Expected Discharge Date:                  Expected Discharge Plan:  Skilled Nursing Facility  In-House Referral:  Clinical Social Work  Discharge planning Services     Post Acute Care Choice:    Choice offered to:     DME Arranged:    DME Agency:     HH Arranged:    HH Agency:     Status of Service:  Completed, signed off  Medicare Important Message Given:  Yes Date Medicare IM Given:  02/14/15 Medicare IM give by:  Gweneth Dimitri Date Additional Medicare IM Given:    Additional Medicare Important Message give by:     If discussed at Long Length of Stay Meetings, dates discussed:    Additional Comments:  Marily Memos, RN 02/14/2015, 12:21 PM

## 2015-02-14 NOTE — Evaluation (Signed)
Occupational Therapy Evaluation Patient Details Name: [name] MRN: [id number] DOB: March 17, 1927 Today's Date: 02/14/2015    History of Present Illness This patient is an 81 year old female who fell 2 weeks ago and has returned to [facility] and now diagnosed with a pelvic fracture.   Clinical Impression   This patient is an 81 year old female who came to [facility] after a fall suffering a pelvic fracture.  She lives  in a one story home with 1 step to enter, She had been independene with ADL and functional mobility including driving until her fall 2 weeks ago. She now requires  assist. She would benefit from Occupational Therapy for ADL/functional mobility training.      Follow Up Recommendations  SNF    Equipment Recommendations   (Taught patient and daughter hip kit and practiced donning doffing pants socks and over hand. Also discussed differnt types of tub benches and detachable shower head. Given list of vendors.)    Recommendations for Other Services       Precautions / Restrictions Precautions Precautions: Fall Restrictions Weight Bearing Restrictions: No      Mobility Bed Mobility Overal bed mobility: Needs Assistance Bed Mobility: Supine to Sit     Supine to sit: Mod assist                  Balance                                 ADL                                         General ADL Comments: Had been independent up to 2 weeks ago, now need much assist secondary to pain.     Vision     Perception     Praxis      Pertinent Vitals/Pain Pain 7     Hand Dominance     Extremity/Trunk Assessment Upper Extremity Assessment Upper Extremity Assessment:  (4-/5 througout )       Communication Communication Communication: No difficulties   Cognition Arousal/Alertness: Awake/alert Behavior During Therapy: WFL for tasks assessed/performed Overall Cognitive Status: Within  Functional Limits for tasks assessed                     General Comments       Exercises       Shoulder Instructions      Home Living Family/patient expects to be discharged to:: Skilled nursing facility Living Arrangements: Alone (Family has assisted since fall.)                                      Prior Functioning/Environment Level of Independence:  (Had been independent with ADL and functional mobility including driving until fall 2 weeks ago.)  Gait / Transfers Assistance Needed: Independent ADL's / Homemaking Assistance Needed: Independent ADLs/Assist IADLs Communication / Swallowing Assistance Needed: Independent      OT Diagnosis: Generalized weakness   OT Problem List:   OT Treatment/Interventions: Self-care/ADL training;Therapeutic exercise    OT Goals(Current goals can be found in the care plan section) Acute Rehab OT Goals Patient Stated Goal: "I want to get better" OT Goal Formulation: With patient/family  Time For Goal Achievement: 02/28/15  OT Frequency: Min 1X/week   Barriers to D/C:            Co-evaluation              End of Session Equipment Utilized During Treatment:  (Hip kit)  Activity Tolerance: Patient limited by pain Patient left: in bed;with call bell/phone within reach;with bed alarm set;with family/visitor present   Time: 1159-1225 OT Time Calculation (min): 26 min Charges:  OT General Charges $OT Visit: 1 Procedure OT Evaluation $Initial OT Evaluation Tier I: 1 Procedure OT Treatments $Self Care/Home Management : 8-22 mins G-Codes: OT G-codes **NOT FOR INPATIENT CLASS** Functional Assessment Tool Used: clinical judgement  Functional Limitation: Self care Self Care Current Status (G8987): At least 60 percent but less than 80 percent impaired, limited or restricted Self Care Goal Status (G8988): At least 1 percent but less than 20 percent impaired, limited or restricted  Huff, John M John M Huff,  MS/OTR/L  02/14/2015, 2:11 PM    

## 2015-02-14 NOTE — ED Provider Notes (Signed)
[facility] Emergency Department Provider Note  ____________________________________________  Time seen: 02:30  I have reviewed the triage vital signs and the nursing notes.   HISTORY  Chief Complaint Hip Pain     HPI [name] is a 81 y.o. female who fell  approximately 2 weeks ago. I saw the patient at that time. She had some discomfort in the right hip and right thigh. She had negative x-rays and was sent home to follow-up with her regular doctor and for further recuperation.  The daughters present with their mother tonight reports that she has not been sleeping well since the fall. She sleeps 2-3 hours but then wakes up uncomfortable. She is still not ambulating as she used to. She is taking shuffle steps and needs additional assistance. Tonight the patient's pain became more severe area she asked to be taken somewhere due to the uncontrolled pain.  The pain is in the right hip as well as in the pelvis.    Past Medical History  Diagnosis Date  . Hypertension     There are no active problems to display for this patient.   Past Surgical History  Procedure Laterality Date  . Kidney surgery    . Cholecystectomy    . Abdominal hysterectomy      Current Outpatient Rx  Name  Route  Sig  Dispense  Refill  . calcium-vitamin D 250-100 MG-UNIT per tablet      1 tablet.         Marland Kitchen ibuprofen (ADVIL,MOTRIN) 200 MG tablet   Oral   Take 200 mg by mouth every 6 (six) hours as needed.         . latanoprost (XALATAN) 0.005 % ophthalmic solution      1 drop at bedtime.         . metoprolol tartrate (LOPRESSOR) 25 MG tablet   Oral   Take 25 mg by mouth 2 (two) times daily.         . Multiple Vitamin (MULTIVITAMIN) capsule   Oral   Take 1 capsule by mouth daily.         Marland Kitchen sulfamethoxazole-trimethoprim (BACTRIM DS,SEPTRA DS) 800-160 MG per tablet   Oral   Take 1 tablet by mouth 2 (two) times daily.   11 tablet   0      Allergies Review of patient's allergies indicates no known allergies.  No family history on file.  Social History History  Substance Use Topics  . Smoking status: Never Smoker   . Smokeless tobacco: Not on file  . Alcohol Use: No    Review of Systems  Constitutional: Negative for fever. Positive for difficulty sleeping. See history of present illness ENT: Negative for sore throat. Cardiovascular: Negative for chest pain. Respiratory: Negative for shortness of breath. Gastrointestinal: Negative for abdominal pain, vomiting and diarrhea. Genitourinary: Negative for dysuria. Musculoskeletal: Pain in the right hip as well as pelvis. Skin: Negative for rash. Neurological: Negative for headaches   10-point ROS otherwise negative.  ____________________________________________   PHYSICAL EXAM:  VITAL SIGNS: ED Triage Vitals  Enc Vitals Group     BP 02/14/15 0049 129/91 mmHg     Pulse Rate 02/14/15 0049 85     Resp 02/14/15 0049 20     Temp 02/14/15 0049 98 F (36.7 C)     Temp Source 02/14/15 0049 Oral     SpO2 02/14/15 0049 100 %     Weight 02/14/15 0049 137 lb (62.143 kg)  Height 02/14/15 0049 5' (1.524 m)     Head Cir --      Peak Flow --      Pain Score 02/14/15 0051 10     Pain Loc --      Pain Edu? --      Excl. in GC? --     Constitutional:  Alert and communicative. No acute distress. ENT   Head: Normocephalic and atraumatic.   Nose: No congestion/rhinnorhea. Cardiovascular: Normal rate, regular rhythm. Respiratory: Normal respiratory effort without tachypnea. Breath sounds are clear and equal bilaterally. No wheezes/rales/rhonchi. Gastrointestinal: Soft and nontender. No distention.  Back: No muscle spasm, no tenderness, no CVA tenderness. Musculoskeletal: The pelvis is stable and there is some tenderness as I push on the initial wings. She has mild tenderness over the right hip. She has fair range of motion. The remainder of the  musculoskeletal exam is normal. No deformity. Good range of motion of the other 3 extremities. Neurologic:  Normal speech and language. No gross focal neurologic deficits are appreciated.  Skin:  Skin is warm, dry. No rash noted. Psychiatric: Mood and affect are normal. Speech and behavior are normal.  ____________________________________________    LABS (pertinent positives/negatives)  CBC: Within normal limits Metabolic panel: Slightly low sodium at 133. Normal potassium of 4.3. Glucose of 93  ____________________________________________  ____________________________________________    RADIOLOGY  Right hip with pelvis:  Fractures of both superior and inferior rami on the right.    ____________________________________________     ____________________________________________   INITIAL IMPRESSION / ASSESSMENT AND PLAN / ED COURSE  Patient with fall 2 weeks ago with right hip pain. We will repeat the x-ray currently. If the x-ray is negative for either hip or pelvis fracture, we will do a CT scan through the hip due to the ongoing pain and difficulty ambulating, inserts for an occult fracture.  I'm treating the patient with 1 Percocet pill now for her pain. She does not appear in any acute distress but does complain of discomfort.  ----------------------------------------- 3:21 AM on 02/14/2015 -----------------------------------------  I have reviewed the x-ray with the patient's daughters. She does have fractures of the superior and inferior rami on the right. We have discussed the needs of the patient and have agreed to have her admitted to the hospital for placement to rehabilitation. I discussed with the with Dr. Anne Hahn who will see the patient in the emergency department.  ____________________________________________   FINAL CLINICAL IMPRESSION(S) / ED DIAGNOSES  Final diagnoses:  Pain, hip, right  Fracture of multiple pubic rami, right, closed, initial  encounter      Darien Ramus, MD 02/14/15 [phone]

## 2015-02-14 NOTE — ED Notes (Signed)
Patient to rm 6 via EMS from home with c/o right hip pain.  EMS reports pain seen in ED approx 2wks ago for same after fall with right hip/thigh pain.  Patient denies that she feel, that she felt like she was going to fall so she sat down.

## 2015-02-14 NOTE — Clinical Social Work Note (Signed)
Clinical Social Work Assessment  Patient Details  Name: [name] MRN: [id number] Date of Birth: 12-05-1926  Date of referral:  02/14/15               Reason for consult:  Facility Placement                Permission sought to share information with:  Family Supports Permission granted to share information::  Yes, Verbal Permission Granted  Name::     [name] or [name] Ward  Agency::     Relationship::  daughters  Contact Information:  [name] [phone] and [name] [phone] Housing/Transportation Living arrangements for the past 2 months:  Single Family Home Source of Information:  Patient Patient Interpreter Needed:  None Criminal Activity/Legal Involvement Pertinent to Current Situation/Hospitalization:  No - Comment as needed Significant Relationships:  Adult Children, Church, Siblings Lives with:  Self Do you feel safe going back to the place where you live?  Yes (When able to walk) Need for family participation in patient care:  Yes (Comment) (Pt would like for her daughters to assist with her care.  )  Care giving concerns:  Pt lives alone and is not currently able to walk.   Social Worker assessment / plan:  Pt is an 81 y/o widowed female who currently lives at home alone.  Pt has 2 daughters who are very supportive.  Pt explained that her daughter [name] makes sure she has food and daughter [name] takes care of her finances.  Prior to the fall pt was fully mobile and independent.  Pt is agreeable to STR should PT recommend this level of care.    Employment status:  Retired Health and safety inspector:  Medicare PT Recommendations:  Not assessed at this time Information / Referral to community resources:     Patient/Family's Response to care:  Pt was pleasant and appreciative of CSW assistance.   Patient/Family's Understanding of and Emotional Response to Diagnosis, Current Treatment, and Prognosis:  Pt realizes she can't walk right now, wants to be able to  return to her baseline and is willing to go to STR if necessary.  Emotional Assessment Appearance:  Appears younger than stated age Attitude/Demeanor/Rapport:  Other (Pleasant) Affect (typically observed):  Accepting, Appropriate, Calm, Pleasant Orientation:  Oriented to Self, Oriented to Place, Oriented to  Time, Oriented to Situation Alcohol / Substance use:  Not Applicable Psych involvement (Current and /or in the community):  No (Comment)  Discharge Needs  Concerns to be addressed:  Discharge Planning Concerns Readmission within the last 30 days:  No Current discharge risk:  Lives alone Barriers to Discharge:  No Barriers Identified   Delight Stare, LCSW 02/14/2015, 10:38 AM

## 2015-02-14 NOTE — Progress Notes (Addendum)
Initial Nutrition Assessment  INTERVENTION: Meals and Snacks: Cater to patient preferences; RD took lunch order for pt and called it down Medical Food Supplement Therapy:  Will recommend  Ensure Enlive (each supplement provides 350kcal and 20 grams of protein) BID  NUTRITION DIAGNOSIS:  Inadequate oral intake related to  (decreased appetite) as evidenced by per patient/family report.  GOAL:  Patient will meet greater than or equal to 90% of their needs  MONITOR:   (Energy Intake, Anthropometrics, Digestive system, Electrolyte and renal Profile)  REASON FOR ASSESSMENT:  Malnutrition Screening Tool    ASSESSMENT:  Pt admitted with pelvic fracture PMHx:  Past Medical History  Diagnosis Date  . Hypertension   . HLD (hyperlipidemia)   . CKD (chronic kidney disease)   . Osteopenia   . Kidney stones   . Macular degeneration   . Glaucoma    Current Nutrition: Pt reports eating 100% of a blueberry muffin this am with coffee.   Food/Nutrition-Related History: Pt reports poor appetite PTA up until the past week where pt reports being hungry all the time. Prior to the past week, pt reported that she was having difficulty moving her bowels and that was contributing to her poor appetite. Pt reports drinking chocolate Boost PTA multiple times a day.  Medications: Calcium-vitamin D, colace, senokot  Electrolyte/Renal Profile and Glucose Profile:   Recent Labs Lab 02/14/15 0451  NA 133*  K 4.3  CL 100*  CO2 30  BUN 16  CREATININE 1.01*  CALCIUM 8.5*  GLUCOSE 93   Protein Profile:  Recent Labs Lab 02/14/15 0451  ALBUMIN 2.7*    Gastrointestinal Profile: Last BM: 6/19  Nutrition-Focused Physical Exam Findings: Nutrition-Focused physical exam completed. Findings are WDL for fat depletion, muscle depletion, and edema.   Weight Change: Pt reports weighing 137lbs at a recent outpatient MD office visit but could not remember how long ago that was. Per CHL 137lbs  documented on 01/30/2015.  Height:  Ht Readings from Last 1 Encounters:  02/14/15 5' (1.524 m)    Weight:  Wt Readings from Last 1 Encounters:  02/14/15 129 lb 12.8 oz (58.877 kg)    Wt Readings from Last 10 Encounters:  02/14/15 129 lb 12.8 oz (58.877 kg)  01/30/15 137 lb (62.143 kg)    BMI:  Body mass index is 25.35 kg/(m^2).  Estimated Nutritional Needs:  Kcal:  1259-1490kcals, BEE: 955kcals, TEE: (IF 1.1-1.3)(AF 1.2)   Protein:  59-71g protein (1.0-1.2g/kg)  Fluid:  1470-1766mL of fluid (25-60mL/kg)   Skin:  Reviewed, no issues  Diet Order:  Diet Heart Room service appropriate?: Yes; Fluid consistency:: Thin  EDUCATION NEEDS:  No education needs identified at this time   Intake/Output Summary (Last 24 hours) at 02/14/15 1429 Last data filed at 02/14/15 1300  Gross per 24 hour  Intake    360 ml  Output    250 ml  Net    110 ml    MODERATE Care Level  Leda Quail, RD, LDN Pager [phone]

## 2015-02-14 NOTE — Evaluation (Addendum)
Physical Therapy Evaluation Patient Details Name: [name] MRN: [id number] DOB: October 03, 1926 Today's Date: 02/14/2015   History of Present Illness  [name]  is a 81 y.o. female who presents with persistent right pelvic pain. Patient had a fall on that side 2 weeks ago, and was seen in the ED that time with a negative x-ray for hip fracture. Since then she has been uncomfortable and had persistent pain, and has had significantly reduced ambulatory ability. She returned to the ED due to progressive worsening of pelvic pain and mobility. Patient and family state that she was driving herself around and ambulating without trouble at baseline prior to this fall 2 weeks ago. Since that time she has required assistance in getting up due to the pain and is been severely limited in her mobility. In the ED pelvic x-ray shows likely mildly displaced fractures involving the right superior and inferior pubic rami. Hospitalists admitted patient under observation status. Daughter reports that even prior to ED visit 2 weeks ago patient was experiencing worsening mobility.   Clinical Impression  Pt reporting high levels of R hip/pelvic pain with all mobility. She is very anxious during session due to severe pain. With encouragement pt is able to sit at EOB, stand, and take a few small steps forward/backward. She has difficulty accepting weight on RLE due to pain. Pt will need SNF placement at discharge in order to facilitate safe return home. Pt will benefit from skilled PT services to address deficits in strength, balance, and mobility in order to return to full function at home.     Follow Up Recommendations SNF    Equipment Recommendations   (To be determined by SNF)    Recommendations for Other Services       Precautions / Restrictions Precautions Precautions: Fall Restrictions Weight Bearing Restrictions: No      Mobility  Bed Mobility Overal bed mobility: Needs Assistance Bed  Mobility: Supine to Sit     Supine to sit: Mod assist     General bed mobility comments: Pt requires heavy encouragement and cues for bed mobility. Reports pain with AROM of RLE  Transfers Overall transfer level: Needs assistance Equipment used: Rolling walker (2 wheeled) Transfers: Sit to/from Stand Sit to Stand: Min assist         General transfer comment: Pt takes extended time to come to standing and is very anxious to attempt. She requires cues for hand placement and minA+1 for stability. Decreased LE power noted  Ambulation/Gait Ambulation/Gait assistance: Min assist Ambulation Distance (Feet): 3 Feet Assistive device: Rolling walker (2 wheeled)       General Gait Details: Pt able to take a few small steps forward and backward. Step-to pattern with pain when loading RLE. Pt uses UE to offload RLE. Pt very anxious and fearful. Reports increase in pain.   Stairs            Wheelchair Mobility    Modified Rankin (Stroke Patients Only)       Balance Overall balance assessment: Needs assistance Sitting-balance support: No upper extremity supported;Feet supported Sitting balance-Leahy Scale: Good     Standing balance support: Bilateral upper extremity supported Standing balance-Leahy Scale: Fair Standing balance comment: Pt primarily limited by pain with weight transfer to RLE                             Pertinent Vitals/Pain Pain Assessment: 0-10 Pain Score: 7  Pain Location:  R hip/pelvis Pain Intervention(s): Premedicated before session    Home Living Family/patient expects to be discharged to:: Skilled nursing facility Living Arrangements: Alone (Family assists)                    Prior Function Level of Independence: Needs assistance   Gait / Transfers Assistance Needed: Independent  ADL's / Homemaking Assistance Needed: Independent ADLs/Assist IADLs        Hand Dominance        Extremity/Trunk Assessment   Upper  Extremity Assessment: Overall WFL for tasks assessed (RUE limited shoulder flexion to 45 degrees (chronic))           Lower Extremity Assessment: Overall WFL for tasks assessed (Limited resistance testing due to R hip/groin pain)      Cervical / Trunk Assessment: Kyphotic  Communication   Communication: No difficulties  Cognition Arousal/Alertness: Awake/alert Behavior During Therapy: WFL for tasks assessed/performed;Anxious Overall Cognitive Status: Within Functional Limits for tasks assessed                      General Comments      Exercises        Assessment/Plan    PT Assessment Patient needs continued PT services  PT Diagnosis Difficulty walking;Acute pain   PT Problem List Decreased strength;Decreased activity tolerance;Decreased balance;Decreased mobility;Decreased knowledge of use of DME;Pain  PT Treatment Interventions DME instruction;Gait training;Stair training;Therapeutic activities;Balance training;Therapeutic exercise;Neuromuscular re-education;Patient/family education   PT Goals (Current goals can be found in the Care Plan section) Acute Rehab PT Goals Patient Stated Goal: "I want to get better" PT Goal Formulation: With patient/family Time For Goal Achievement: 02/28/15 Potential to Achieve Goals: Good    Frequency 7X/week   Barriers to discharge        Co-evaluation               End of Session Equipment Utilized During Treatment: Gait belt Activity Tolerance: Patient limited by pain Patient left: in bed;with call bell/phone within reach;with bed alarm set;with family/visitor present Nurse Communication: Mobility status         Time: 1100-1130 PT Time Calculation (min) (ACUTE ONLY): 30 min   Charges:   PT Evaluation $Initial PT Evaluation Tier I: 1 Procedure     PT G Codes:   PT G-Codes **NOT FOR INPATIENT CLASS** Functional Assessment Tool Used: Clinical judgement Functional Limitation: Mobility: Walking and moving  around Mobility: Walking and Moving Around Current Status ([id number]): At least 40 percent but less than 60 percent impaired, limited or restricted Mobility: Walking and Moving Around Goal Status [phone]): At least 1 percent but less than 20 percent impaired, limited or restricted   Lynnea Maizes PT, DPT   Huprich,Jason 02/14/2015, 1:40 PM

## 2015-02-14 NOTE — H&P (Signed)
[facility] - [place] at [facility]   PATIENT NAME: [name]    MR#:  [id number]  DATE OF BIRTH:  1927/06/18  DATE OF ADMISSION:  02/14/2015  PRIMARY CARE PHYSICIAN: Marguarite Arbour, MD   REQUESTING/REFERRING PHYSICIAN: Carollee Massed  CHIEF COMPLAINT:   Chief Complaint  Patient presents with  . Hip Pain    HISTORY OF PRESENT ILLNESS:  [name]  is a 81 y.o. female who presents with persistent right pelvic pain. Patient had a fall on that side 2 weeks ago, and was seen in the ED that time with a negative x-ray for hip fracture. Since then she has been uncomfortable and had persistent pain, and has had significantly reduced ambulatory ability. She returns tonight he goes she was not getting any better. She is accompanied tonight by her daughter, who is present and contributes to history. Patient and family state that she was driving herself around and ambulating without trouble at baseline prior to this fall 2 weeks ago. Since that time she has required assistance in getting up due to the pain and is been severely limited in her mobility. In the ED tonight pelvic x-ray shows likely mildly displaced fractures involving the right superior and inferior pubic rami. Hospitalists were called for admission for the same.  PAST MEDICAL HISTORY:   Past Medical History  Diagnosis Date  . Hypertension   . HLD (hyperlipidemia)   . CKD (chronic kidney disease)   . Osteopenia   . Kidney stones   . Macular degeneration   . Glaucoma     PAST SURGICAL HISTORY:   Past Surgical History  Procedure Laterality Date  . Kidney surgery    . Cholecystectomy    . Abdominal hysterectomy    . Appendectomy    . Partial removal left kidney    . Hernia repair    . Cataract extraction      SOCIAL HISTORY:   History  Substance Use Topics  . Smoking status: Never Smoker   . Smokeless tobacco: Not on file  . Alcohol Use: No    FAMILY HISTORY:   Family History   Problem Relation Age of Onset  . Transient ischemic attack Mother   . Heart disease Father   . Hypertension Brother   . Hypertension Sister     DRUG ALLERGIES:  No Known Allergies  MEDICATIONS AT HOME:   Prior to Admission medications   Medication Sig Start Date End Date Taking? Authorizing Provider  calcium-vitamin D 250-100 MG-UNIT per tablet 1 tablet.   Yes Historical Provider, MD  ibuprofen (ADVIL,MOTRIN) 200 MG tablet Take 200 mg by mouth every 6 (six) hours as needed.   Yes Historical Provider, MD  latanoprost (XALATAN) 0.005 % ophthalmic solution 1 drop at bedtime.   Yes Historical Provider, MD  metoprolol tartrate (LOPRESSOR) 25 MG tablet Take 25 mg by mouth 2 (two) times daily.   Yes Historical Provider, MD  Multiple Vitamin (MULTIVITAMIN) capsule Take 1 capsule by mouth daily.   Yes Historical Provider, MD    REVIEW OF SYSTEMS:  Review of Systems  Constitutional: Negative for fever, chills, weight loss and malaise/fatigue.  HENT: Negative for ear pain, hearing loss and tinnitus.   Eyes: Negative for blurred vision, double vision, pain and redness.  Respiratory: Negative for cough, hemoptysis and shortness of breath.   Cardiovascular: Negative for chest pain, palpitations, orthopnea and leg swelling.  Gastrointestinal: Negative for nausea, vomiting, abdominal pain, diarrhea and constipation.  Genitourinary: Negative for dysuria, frequency  and hematuria.  Musculoskeletal: Positive for joint pain (right hip/pelvic pain) and falls ( 2 weeks ago). Negative for back pain and neck pain.  Skin:       No acne, rash, or lesions  Neurological: Negative for dizziness, tremors, focal weakness and weakness.  Endo/Heme/Allergies: Negative for polydipsia. Does not bruise/bleed easily.  Psychiatric/Behavioral: Negative for depression. The patient is not nervous/anxious and does not have insomnia.      VITAL SIGNS:   Filed Vitals:   02/14/15 0130 02/14/15 0200 02/14/15 0230  02/14/15 0332  BP: 118/66 134/80 124/69 133/69  Pulse: 74 77 78 77  Temp:      TempSrc:      Resp: 18 20 18 20   Height:      Weight:      SpO2: 97% 98% 97% 98%   Wt Readings from Last 3 Encounters:  02/14/15 62.143 kg (137 lb)  01/30/15 62.143 kg (137 lb)    PHYSICAL EXAMINATION:  Physical Exam  Constitutional: She is oriented to person, place, and time. She appears well-developed and well-nourished. No distress.  HENT:  Head: Normocephalic and atraumatic.  Mouth/Throat: Oropharynx is clear and moist.  Eyes: Conjunctivae and EOM are normal. Pupils are equal, round, and reactive to light. No scleral icterus.  Neck: Normal range of motion. Neck supple. No JVD present. No thyromegaly present.  Cardiovascular: Normal rate, regular rhythm and intact distal pulses.  Exam reveals no gallop and no friction rub.   No murmur heard. Respiratory: Effort normal and breath sounds normal. No respiratory distress. She has no wheezes. She has no rales.  GI: Soft. Bowel sounds are normal. She exhibits no distension. There is no tenderness.  Musculoskeletal: She exhibits no edema.  Pelvic/hip pain with passive and active movement of the right lower extremity, No arthritis, no gout  Lymphadenopathy:    She has no cervical adenopathy.  Neurological: She is alert and oriented to person, place, and time. No cranial nerve deficit.  No dysarthria, no aphasia  Skin: Skin is warm and dry. No rash noted. No erythema.  Psychiatric: She has a normal mood and affect. Her behavior is normal. Judgment and thought content normal.    LABORATORY PANEL:   CBC No results for input(s): WBC, HGB, HCT, PLT in the last 168 hours. ------------------------------------------------------------------------------------------------------------------  Chemistries  No results for input(s): NA, K, CL, CO2, GLUCOSE, BUN, CREATININE, CALCIUM, MG, AST, ALT, ALKPHOS, BILITOT in the last 168 hours.  Invalid input(s):  GFRCGP ------------------------------------------------------------------------------------------------------------------  Cardiac Enzymes No results for input(s): TROPONINI in the last 168 hours. ------------------------------------------------------------------------------------------------------------------  RADIOLOGY:  Dg Hip Unilat With Pelvis 2-3 Views Right  02/14/2015   CLINICAL DATA:  Subacute onset of right hip pain. Initial encounter.  EXAM: RIGHT HIP (WITH PELVIS) 2-3 VIEWS  COMPARISON:  None.  FINDINGS: There are likely mildly displaced fractures involving the right superior and inferior pubic rami. No additional fractures are seen.  Both femoral heads are seated normally within their respective acetabula. The proximal right femur appears intact. Mild degenerative change is noted at the lower lumbar spine. The sacroiliac joints are grossly unremarkable.  The visualized bowel gas pattern is grossly unremarkable in appearance. A mesh is noted overlying the right side of the pelvis. Diffuse vascular calcifications are seen.  IMPRESSION: 1. Likely mildly displaced fractures involving the right superior and inferior pubic rami. 2. Diffuse vascular calcifications seen.   Electronically Signed   By: Roanna Raider M.D.   On: 02/14/2015 03:42    EKG:  Orders placed or performed during the hospital encounter of 01/30/15  . ED EKG  . ED EKG  . EKG    IMPRESSION AND PLAN:  Principal Problem:   Pelvic fracture - possibly related unlikely present since her fall 2 weeks ago. We will make sure she has adequate pain control on board, get orthopedic consult for any further recommendations, order PT and OT. Active Problems:   Osteopenia - continue calcium and vitamin D   HTN (hypertension) - continue home medication   Glaucoma - continue home medication   CKD (chronic kidney disease), stage III - avoid nephrotoxins and monitor, use ibuprofen/other NSAIDs sparingly for analgesia.   All the  records are reviewed and case discussed with ED provider. Management plans discussed with the patient and/or family.  DVT PROPHYLAXIS: SubQ heparin  ADMISSION STATUS: Observation  CODE STATUS: DO NOT RESUSCITATE  TOTAL TIME TAKING CARE OF THIS PATIENT: 45 minutes.    Desarie Feild FIELDING 02/14/2015, 3:57 AM  [facility]  Office  [phone]  CC: Primary care physician; Marguarite Arbour, MD

## 2015-02-15 LAB — URINALYSIS COMPLETE WITH MICROSCOPIC (ARMC ONLY)
Bacteria, UA: NONE SEEN
Bilirubin Urine: NEGATIVE
Glucose, UA: NEGATIVE mg/dL
HGB URINE DIPSTICK: NEGATIVE
Ketones, ur: NEGATIVE mg/dL
Leukocytes, UA: NEGATIVE
NITRITE: NEGATIVE
PROTEIN: NEGATIVE mg/dL
Specific Gravity, Urine: 1.023 (ref 1.005–1.030)
pH: 5 (ref 5.0–8.0)

## 2015-02-15 MED ORDER — OXYCODONE-ACETAMINOPHEN 5-325 MG PO TABS: 1.0000 | ORAL_TABLET | ORAL | Status: AC | PRN

## 2015-02-15 MED ORDER — DOCUSATE SODIUM 100 MG PO CAPS: 100.0000 mg | ORAL_CAPSULE | Freq: Two times a day (BID) | ORAL | Status: AC

## 2015-02-15 MED ORDER — ENSURE ENLIVE PO LIQD: 237.0000 mL | Freq: Two times a day (BID) | ORAL | Status: AC

## 2015-02-15 MED ORDER — SENNA 8.6 MG PO TABS: 1.0000 | ORAL_TABLET | Freq: Two times a day (BID) | ORAL | Status: AC

## 2015-02-15 MED ORDER — ACETAMINOPHEN 325 MG PO TABS: 650.0000 mg | ORAL_TABLET | Freq: Four times a day (QID) | ORAL | Status: AC | PRN

## 2015-02-15 NOTE — Progress Notes (Signed)
Patient is medically stable for D/C to [facility] today. [facility] authorization has been received. Auth # [id number]. Per [facility] admissions coordinator at [facility] patient is going to room 407. RN will call report to 400 hall RN and arrange EMS for transport. Clinical Child psychotherapist (CSW) prepared D/C packet and sent D/C Summary to [facility] via carefinder. Patient is aware of above. CSW contacted patient's daughter [name] and made her aware of above. Please reconsult if future social work needs arise. CSW signing off.   Jetta Lout, LCSWA [phone]

## 2015-02-15 NOTE — Discharge Instructions (Signed)
Physical therapy.  Weight bearing as tolerated.  Follow up with Dr. Judithann Sheen in one week.  Follow up with [facility] in 6 weeks.

## 2015-02-15 NOTE — Clinical Social Work Placement (Signed)
   CLINICAL SOCIAL WORK PLACEMENT  NOTE  Date:  02/15/2015  Patient Details  Name: [name] MRN: [id number] Date of Birth: 07-Mar-1927  Clinical Social Work is seeking post-discharge placement for this patient at the Skilled  Nursing Facility level of care (*CSW will initial, date and re-position this form in  chart as items are completed):  Yes   Patient/family provided with [place] Clinical Social Work Department's list of facilities offering this level of care within the geographic area requested by the patient (or if unable, by the patient's family).  Yes   Patient/family informed of their freedom to choose among providers that offer the needed level of care, that participate in Medicare, Medicaid or managed care program needed by the patient, have an available bed and are willing to accept the patient.  Yes   Patient/family informed of 's ownership interest in [facility] and [facility], as well as of the fact that they are under no obligation to receive care at these facilities.  PASRR submitted to EDS on 02/14/15     PASRR number received on 02/14/15     Existing PASRR number confirmed on       FL2 transmitted to all facilities in geographic area requested by pt/family on 02/14/15     FL2 transmitted to all facilities within larger geographic area on       Patient informed that his/her managed care company has contracts with or will negotiate with certain facilities, including the following:        Yes   Patient/family informed of bed offers received.  Patient chooses bed at  [facility] )     Physician recommends and patient chooses bed at      Patient to be transferred to  [facility] ) on 02/15/15.  Patient to be transferred to facility by  [facility] )     Patient family notified on 02/15/15 of transfer.  Name of family member notified:   (Daughter [name] via telephone )     PHYSICIAN       Additional  Comment:    _______________________________________________ Haig Prophet, LCSW 02/15/2015, 11:52 AM

## 2015-02-15 NOTE — Progress Notes (Signed)
Subjective :Patient is day # 2 for right superior and inferior pubic rami fracture Patient reports pain as no pain unless I move.   no nausea and no vomiting No chest pains or shortness of breath. Patient states that "going to heaven today"   Objective: Vital signs in last 24 hours: Temp:  [97.5 F (36.4 C)-98.4 F (36.9 C)] 98.4 F (36.9 C) (06/21 0412) Pulse Rate:  [62-128] 69 (06/21 0412) Resp:  [16-18] 16 (06/21 0412) BP: (113-133)/(64-86) 120/64 mmHg (06/21 0412) SpO2:  [96 %-99 %] 96 % (06/21 0412)  Wound check No wounds to check Appears to be within normal limits to the right lower extremity limited by pain Has good gross motor strength against resistance of dorsiflexion and plantarflexion of the foot  Intake/Output from previous day: 06/20 0701 - 06/21 0700 In: 1080 [P.O.:1080] Out: 525 [Urine:525] Intake/Output this shift:     Recent Labs  02/14/15 0451  HGB 12.5    Recent Labs  02/14/15 0451  WBC 8.1  RBC 3.94  HCT 36.4  PLT 272    Recent Labs  02/14/15 0451  NA 133*  K 4.3  CL 100*  CO2 30  BUN 16  CREATININE 1.01*  GLUCOSE 93  CALCIUM 8.5*   No results for input(s): LABPT, INR in the last 72 hours.  Neurologically intact Neurovascular intact Sensation intact distally Intact pulses distally Dorsiflexion/Plantar flexion intact  Assessment/Plan: Advance diet as tolerated Case management to assist with discharge planning Physical therapy today. Weight-bear as tolerated Bowel movement today Labs in am Plan to discharge when medically stable We'll need to follow-up in [facility] orthopedics in 6 weeks   WOLFE,JON R. 02/15/2015, 7:05 AM

## 2015-02-15 NOTE — Consult Note (Signed)
ORTHOPAEDIC CONSULTATION  REQUESTING PHYSICIAN: Marguarite Arbour, MD  Chief Complaint: Right hip pain  HPI: [name] is a 81 y.o. female who fell approximately 2 weeks ago and had the onset of right hip pain.  She had difficulty walking due to the pain. She was apparently evaluated in the ER and was told there was no fracture. However, the pain has increased in severity and has significantly impaired her mobility. Prior to the fall she was ambulating without any ambulatory aids.  The nurses state that the patient was unable to transfer to the bedside commode last night due to the pain and required the use of a bedpan. Upon questioning this morning, she stated that she was not having any pain while laying still in the bed.  Past Medical History  Diagnosis Date  . Hypertension   . HLD (hyperlipidemia)   . CKD (chronic kidney disease)   . Osteopenia   . Kidney stones   . Macular degeneration   . Glaucoma    Past Surgical History  Procedure Laterality Date  . Kidney surgery    . Cholecystectomy    . Abdominal hysterectomy    . Appendectomy    . Partial removal left kidney    . Hernia repair    . Cataract extraction     History   Social History  . Marital Status: Married    Spouse Name: N/A  . Number of Children: N/A  . Years of Education: N/A   Social History Main Topics  . Smoking status: Never Smoker   . Smokeless tobacco: Not on file  . Alcohol Use: No  . Drug Use: No  . Sexual Activity: No   Other Topics Concern  . None   Social History Narrative   Family History  Problem Relation Age of Onset  . Transient ischemic attack Mother   . Heart disease Father   . Hypertension Brother   . Hypertension Sister    No Known Allergies Prior to Admission medications   Medication Sig Start Date End Date Taking? Authorizing Provider  calcium-vitamin D 250-100 MG-UNIT per tablet 1 tablet.   Yes Historical Provider, MD  ibuprofen (ADVIL,MOTRIN) 200 MG  tablet Take 200 mg by mouth every 6 (six) hours as needed.   Yes Historical Provider, MD  latanoprost (XALATAN) 0.005 % ophthalmic solution 1 drop at bedtime.   Yes Historical Provider, MD  metoprolol tartrate (LOPRESSOR) 25 MG tablet Take 25 mg by mouth 2 (two) times daily.   Yes Historical Provider, MD  Multiple Vitamin (MULTIVITAMIN) capsule Take 1 capsule by mouth daily.   Yes Historical Provider, MD  acetaminophen (TYLENOL) 325 MG tablet Take 2 tablets (650 mg total) by mouth every 6 (six) hours as needed for mild pain (or Fever >/= 101). 02/15/15   Leotis Shames, MD  docusate sodium (COLACE) 100 MG capsule Take 1 capsule (100 mg total) by mouth 2 (two) times daily. 02/15/15   Leotis Shames, MD  feeding supplement, ENSURE ENLIVE, (ENSURE ENLIVE) LIQD Take 237 mLs by mouth 2 (two) times daily with a meal. 02/15/15   Leotis Shames, MD  oxyCODONE-acetaminophen (PERCOCET/ROXICET) 5-325 MG per tablet Take 1 tablet by mouth every 4 (four) hours as needed for severe pain. 02/15/15   Leotis Shames, MD  senna (SENOKOT) 8.6 MG TABS tablet Take 1 tablet (8.6 mg total) by mouth 2 (two) times daily. 02/15/15   Leotis Shames, MD   Dg Hip Unilat With Pelvis 2-3 Views Right  02/14/2015  CLINICAL DATA:  Subacute onset of right hip pain. Initial encounter.  EXAM: RIGHT HIP (WITH PELVIS) 2-3 VIEWS  COMPARISON:  None.  FINDINGS: There are likely mildly displaced fractures involving the right superior and inferior pubic rami. No additional fractures are seen.  Both femoral heads are seated normally within their respective acetabula. The proximal right femur appears intact. Mild degenerative change is noted at the lower lumbar spine. The sacroiliac joints are grossly unremarkable.  The visualized bowel gas pattern is grossly unremarkable in appearance. A mesh is noted overlying the right side of the pelvis. Diffuse vascular calcifications are seen.  IMPRESSION: 1. Likely mildly displaced fractures involving the right superior  and inferior pubic rami. 2. Diffuse vascular calcifications seen.   Electronically Signed   By: Roanna Raider M.D.   On: 02/14/2015 03:42    Positive ROS: All other systems have been reviewed and were otherwise negative with the exception of those mentioned in the HPI and as above.  Physical Exam: General: Alert and alert in no acute distress. She was somewhat confused this AM. HEENT: Atraumatic and normocephalic. Sclera are clear. Extraocular motion is intact. Oropharynx is clear with moist mucosa. Neck: Supple, nontender, good range of motion.  Lungs: Clear to auscultation bilaterally. Cardiovascular: Regular rate and rhythm with normal S1 and S2. No murmurs. No gallops or rubs. Pedal pulses are palpable bilaterally. Homans test is negative bilaterally. No significant pretibial or ankle edema. Abdomen: Soft, nontender, and nondistended. Bowel sounds are present. Skin: No lesions in the area of chief complaint Neurologic: Awake, alert, and oriented. Sensory function is grossly intact. Motor strength is felt to be 5 over 5 bilaterally with the exception of the right hip musculature which was difficult to assess secondary to guarding. No clonus or tremor. Good motor coordination. Lymphatic: No axillary or cervical lymphadenopathy  MUSCULOSKELETAL: Examination of the right lower extremity demonstrates guarding and pain with attempts at range of motion of the hip actively. Gentle passive range of motion of the hip is tolerated. The knee is nontender and without effusion.  Assessment: Right superior and inferior pubic rami fractures  Plan: The findings were discussed with the patient. I agree with the patient for bed mobility and transfers, progressing to ambulation with ambulatory aids as tolerated. Careful pain management is important, with attention to a good bowel program while on narcotics.  I anticipate that the patient will require SND.  She may follow up in the office in 4  weeks.  Edwin Cherian P. Angie Fava M.D.

## 2015-02-15 NOTE — Progress Notes (Signed)
Physical Therapy Treatment Patient Details Name: [name] MRN: [id number] DOB: March 12, 1927 Today's Date: 02/15/2015    History of Present Illness This patient is an 81 year old female who fell 2 weeks ago and has returned to [facility] and now diagnosed with a pelvic fracture.    PT Comments    Pt demonstrates improved bed mobility, transfers, and ambulation on this date but it is still significantly limited by pain. She fatigues quickly and reports 10/10 pain with all mobility. Pt is able to complete all bed exercises as instructed and demonstrates good strength in RLE with SLR. Pt will need SNF placement at discharge to facilitate return to prior level of function at home. Pt will benefit from skilled PT services to address deficits in strength, balance, and mobility in order to return to full function at home.    Follow Up Recommendations  SNF     Equipment Recommendations   (To be determined by SNF)    Recommendations for Other Services       Precautions / Restrictions Precautions Precautions: Fall Restrictions Weight Bearing Restrictions: No    Mobility  Bed Mobility Overal bed mobility: Needs Assistance Bed Mobility: Supine to Sit     Supine to sit: Mod assist     General bed mobility comments: Pt reporting severe pain with all AROM and RLE. She requires encouragement but is able to perform with slightly more ease than yesterday. Pt has difficulty taking weight on R hip in sitting  Transfers Overall transfer level: Needs assistance Equipment used: Rolling walker (2 wheeled) Transfers: Sit to/from Stand Sit to Stand: Min assist         General transfer comment: Pt takes extended time to come to standing but improved from yesterday. She reports 10/10 pain with transfer but once standing pain decreases. Increased UE weightbearing in standing. Cues for hand placement and sequencing.  Ambulation/Gait Ambulation/Gait assistance: Min assist Ambulation  Distance (Feet): 10 Feet Assistive device: Rolling walker (2 wheeled) Gait Pattern/deviations: Step-to pattern   Gait velocity interpretation: <1.8 ft/sec, indicative of risk for recurrent falls General Gait Details: Pt able to increase ambulation distance today compared to yesterday. Education regarding proper sequencing provided to patient. Pt fatigues during ambulation resting forearms on walker at point. Pain levels continue to remain high and pt unable to ambulate further so returned to bed.   Stairs            Wheelchair Mobility    Modified Rankin (Stroke Patients Only)       Balance Overall balance assessment: Needs assistance Sitting-balance support: No upper extremity supported Sitting balance-Leahy Scale: Fair     Standing balance support: Bilateral upper extremity supported Standing balance-Leahy Scale: Poor                      Cognition Arousal/Alertness: Awake/alert Behavior During Therapy: WFL for tasks assessed/performed Overall Cognitive Status: Within Functional Limits for tasks assessed (AOx3)                      Exercises General Exercises - Lower Extremity Ankle Circles/Pumps: Strengthening;Both;10 reps;Supine Quad Sets: Strengthening;Both;10 reps;Supine Gluteal Sets: Strengthening;10 reps;Supine;Both Hip ABduction/ADduction: Strengthening;Both;10 reps;Supine Straight Leg Raises: Strengthening;Both;10 reps;Supine    General Comments        Pertinent Vitals/Pain Pain Assessment: 0-10 Pain Score: 10-Worst pain ever Pain Location: R hip/pelvics (with movement. 0/10 at rest) Pain Intervention(s): Premedicated before session    Home Living  Prior Function            PT Goals (current goals can now be found in the care plan section) Acute Rehab PT Goals Patient Stated Goal: "I want to get better" PT Goal Formulation: With patient Time For Goal Achievement: 02/28/15 Potential to Achieve  Goals: Good Progress towards PT goals: Progressing toward goals    Frequency  7X/week    PT Plan Current plan remains appropriate    Co-evaluation             End of Session Equipment Utilized During Treatment: Gait belt Activity Tolerance: Patient limited by pain Patient left: in bed;with call bell/phone within reach;with bed alarm set;with family/visitor present     Time: 1610-9604 PT Time Calculation (min) (ACUTE ONLY): 18 min  Charges:  $Therapeutic Exercise: 8-22 mins                    G Codes:      [name] PT, DPT   [name],[name] 02/15/2015, 11:19 AM

## 2015-02-15 NOTE — Discharge Summary (Signed)
Physician Discharge Summary  Patient ID: [name] MRN: [id number] DOB/AGE: 1927/05/19 81 y.o.  Admit date: 02/14/2015 Discharge date: 02/15/2015  Admission Diagnoses: Right superior and inferior pubic rami fracture  Discharge Diagnoses:  Principal Problem:   Pelvic fracture Active Problems:   HTN (hypertension)   Glaucoma   CKD (chronic kidney disease), stage III   Osteopenia   Discharged Condition: stable  Hospital Course: Patient was admitted for pain management. Patient has no pain unless she moves. No chest pain, SOB or other complaints.  She was evaluated by orthopedics. Plan is to discharge home today. Physical therapy and weight bearing as tolerated. Case management to assist with discharge planning. She will follow up with [facility] in 6 weeks.   Consults: orthopedic surgery  Discharge Exam: Blood pressure 120/64, pulse 69, temperature 98.4 F (36.9 C), temperature source Oral, resp. rate 16, height 5' (1.524 m), weight 58.877 kg (129 lb 12.8 oz), SpO2 96 %. General appearance: alert, cooperative, appears stated age and no distress Head: Normocephalic, without obvious abnormality, atraumatic Neck: no adenopathy, no JVD, supple, symmetrical, trachea midline and thyroid not enlarged. Resp: clear to auscultation bilaterally Cardio: regular rate and rhythm, S1, S2 normal, no gallop GI: soft, non-tender; bowel sounds normal; no masses. Extremities: extremities normal, atraumatic, no cyanosis or edema, intact pulses distally Dorsiflexion/Plantar flexion intact Neurologic: Alert and oriented X 3, normal strength and tone, sensation intact distally.   Disposition: 01-Home or Self Care     Medication List    TAKE these medications        acetaminophen 325 MG tablet  Commonly known as:  TYLENOL  Take 2 tablets (650 mg total) by mouth every 6 (six) hours as needed for mild pain (or Fever >/= 101).     calcium-vitamin D 250-100 MG-UNIT per  tablet  1 tablet.     docusate sodium 100 MG capsule  Commonly known as:  COLACE  Take 1 capsule (100 mg total) by mouth 2 (two) times daily.     feeding supplement (ENSURE ENLIVE) Liqd  Take 237 mLs by mouth 2 (two) times daily with a meal.     ibuprofen 200 MG tablet  Commonly known as:  ADVIL,MOTRIN  Take 200 mg by mouth every 6 (six) hours as needed.     latanoprost 0.005 % ophthalmic solution  Commonly known as:  XALATAN  1 drop at bedtime.     metoprolol tartrate 25 MG tablet  Commonly known as:  LOPRESSOR  Take 25 mg by mouth 2 (two) times daily.     multivitamin capsule  Take 1 capsule by mouth daily.     oxyCODONE-acetaminophen 5-325 MG per tablet  Commonly known as:  PERCOCET/ROXICET  Take 1 tablet by mouth every 4 (four) hours as needed for severe pain.     senna 8.6 MG Tabs tablet  Commonly known as:  SENOKOT  Take 1 tablet (8.6 mg total) by mouth 2 (two) times daily.         Signed: Trishelle Devora 02/15/2015, 8:33 AM

## 2015-02-16 LAB — URINE CULTURE

## 2015-04-28 DEATH — deceased
